# Patient Record
Sex: Male | Born: 1945 | Race: White | Hispanic: No | Marital: Married | Smoking: Former smoker
Health system: Southern US, Community
[De-identification: ages and names within clinical notes are randomized; demographics above are authoritative.]

## PROBLEM LIST (undated history)

## (undated) DIAGNOSIS — K859 Acute pancreatitis without necrosis or infection, unspecified: Secondary | ICD-10-CM

## (undated) DIAGNOSIS — M1990 Unspecified osteoarthritis, unspecified site: Secondary | ICD-10-CM

## (undated) DIAGNOSIS — H9190 Unspecified hearing loss, unspecified ear: Secondary | ICD-10-CM

## (undated) DIAGNOSIS — M797 Fibromyalgia: Secondary | ICD-10-CM

## (undated) DIAGNOSIS — T82868A Thrombosis of vascular prosthetic devices, implants and grafts, initial encounter: Secondary | ICD-10-CM

## (undated) HISTORY — PX: AORTO-FEMORAL BYPASS GRAFT: SHX885

## (undated) HISTORY — PX: CORONARY ANGIOPLASTY WITH STENT PLACEMENT: SHX49

## (undated) HISTORY — PX: CHOLECYSTECTOMY: SHX55

---

## 2018-03-17 ENCOUNTER — Other Ambulatory Visit: Payer: Self-pay

## 2018-03-17 ENCOUNTER — Emergency Department (HOSPITAL_COMMUNITY): Payer: Medicare Other

## 2018-03-17 ENCOUNTER — Emergency Department (HOSPITAL_COMMUNITY)
Admission: EM | Admit: 2018-03-17 | Discharge: 2018-03-17 | Disposition: A | Discharge status: Home / Self Care | Payer: Medicare Other | Attending: Emergency Medicine | Admitting: Emergency Medicine

## 2018-03-17 DIAGNOSIS — Z79899 Other long term (current) drug therapy: Secondary | ICD-10-CM | POA: Insufficient documentation

## 2018-03-17 DIAGNOSIS — W1830XA Fall on same level, unspecified, initial encounter: Secondary | ICD-10-CM | POA: Diagnosis not present

## 2018-03-17 DIAGNOSIS — E119 Type 2 diabetes mellitus without complications: Secondary | ICD-10-CM | POA: Insufficient documentation

## 2018-03-17 DIAGNOSIS — Z7984 Long term (current) use of oral hypoglycemic drugs: Secondary | ICD-10-CM | POA: Insufficient documentation

## 2018-03-17 DIAGNOSIS — I10 Essential (primary) hypertension: Secondary | ICD-10-CM | POA: Diagnosis not present

## 2018-03-17 DIAGNOSIS — Y999 Unspecified external cause status: Secondary | ICD-10-CM | POA: Insufficient documentation

## 2018-03-17 DIAGNOSIS — S0990XA Unspecified injury of head, initial encounter: Secondary | ICD-10-CM | POA: Insufficient documentation

## 2018-03-17 DIAGNOSIS — Y939 Activity, unspecified: Secondary | ICD-10-CM | POA: Diagnosis not present

## 2018-03-17 DIAGNOSIS — S51011A Laceration without foreign body of right elbow, initial encounter: Secondary | ICD-10-CM | POA: Insufficient documentation

## 2018-03-17 DIAGNOSIS — S40011A Contusion of right shoulder, initial encounter: Secondary | ICD-10-CM

## 2018-03-17 DIAGNOSIS — Y92009 Unspecified place in unspecified non-institutional (private) residence as the place of occurrence of the external cause: Secondary | ICD-10-CM | POA: Insufficient documentation

## 2018-03-17 DIAGNOSIS — Z23 Encounter for immunization: Secondary | ICD-10-CM | POA: Insufficient documentation

## 2018-03-17 DIAGNOSIS — T07XXXA Unspecified multiple injuries, initial encounter: Secondary | ICD-10-CM | POA: Diagnosis present

## 2018-03-17 DIAGNOSIS — W19XXXA Unspecified fall, initial encounter: Secondary | ICD-10-CM

## 2018-03-17 LAB — CBG MONITORING, ED: Glucose-Capillary: 145 mg/dL — ABNORMAL HIGH (ref 70–99)

## 2018-03-17 MED ORDER — TETANUS-DIPHTH-ACELL PERTUSSIS 5-2.5-18.5 LF-MCG/0.5 IM SUSP
0.5000 mL | Freq: Once | INTRAMUSCULAR | Status: AC
  Administered 2018-03-17: 0.5 mL via INTRAMUSCULAR
  Filled 2018-03-17: qty 0.5

## 2018-03-17 MED ORDER — BACITRACIN ZINC 500 UNIT/GM EX OINT
1.0000 "application " | TOPICAL_OINTMENT | Freq: Two times a day (BID) | CUTANEOUS | Status: DC
  Administered 2018-03-17: 1 via TOPICAL
  Filled 2018-03-17: qty 0.9

## 2018-03-17 NOTE — ED Notes (Signed)
Patient transported to CT 

## 2018-03-17 NOTE — ED Notes (Signed)
Bed: WA17 Expected date:  Expected time:  Means of arrival:  Comments: 72 yo fall

## 2018-03-17 NOTE — ED Provider Notes (Signed)
[facility]-EMERGENCY DEPT Provider Note   CSN: [id number] Arrival date & time: 03/17/18  1805     History   Chief Complaint Chief Complaint  Patient presents with  . Fall    HPI Timothy Robertson is a 72 y.o. male.  72 year old male w/ past medical history including hypertension, hyperlipidemia, type 2 diabetes mellitus who presents with fall.  This afternoon, the patient was trying to get to the bathroom when he lost his balance and fell against the wall on his left side, bumping the left side of his head and then falling to the floor on his right side.  He does not think he lost consciousness but his wife is concerned that he may have briefly.  He complains of pain in his right shoulder, right elbow, and hip but his hip pain has improved.  He has been able to bear weight on his legs since the fall.  He denies any vision changes, vomiting, chest pain, or abdominal pain.  No anticoagulant use.  Unknown last tetanus.  Pain is moderate and constant.  The history is provided by the patient.  Fall     No past medical history on file.  There are no active problems to display for this patient.   ** The histories are not reviewed yet. Please review them in the "History" navigator section and refresh this SmartLink.    PMH:  HTN HLD T2DM   Home Medications    Prior to Admission medications   Medication Sig Start Date End Date Taking? Authorizing Provider  aspirin EC 81 MG tablet Take 81 mg by mouth every other day.   Yes [provider]  atorvastatin (LIPITOR) 80 MG tablet Take 80 mg by mouth daily.   Yes [provider]  Calcium Carb-Cholecalciferol (CALCIUM-VITAMIN D) 600-400 MG-UNIT TABS Take 1 tablet by mouth daily.   Yes [provider]  cetirizine (ZYRTEC ALLERGY) 10 MG tablet Take 10 mg by mouth daily.   Yes [provider]  gabapentin (NEURONTIN) 300 MG capsule Take 300-600 mg by mouth 2 (two) times daily. Take 1  capsule (300 mg) in the morning and Take 2 capsules (600 mg) in the evening.    Yes [provider]  glipiZIDE (GLUCOTROL) 10 MG tablet Take 10 mg by mouth daily before breakfast.   Yes [provider]  hydrochlorothiazide (HYDRODIURIL) 25 MG tablet Take 25 mg by mouth daily.   Yes [provider]  Ibuprofen (ADVIL MIGRAINE) 200 MG CAPS Take 400 mg by mouth 2 (two) times daily as needed (pain).   Yes [provider]  lisinopril (PRINIVIL,ZESTRIL) 40 MG tablet Take 40 mg by mouth daily.   Yes [provider]  metFORMIN (GLUCOPHAGE) 1000 MG tablet Take 1,000 mg by mouth 2 (two) times daily with a meal.   Yes [provider]  metoprolol tartrate (LOPRESSOR) 50 MG tablet Take 50 mg by mouth 2 (two) times daily.   Yes [provider]  Omega-3 Fatty Acids (FISH OIL) 1000 MG CAPS Take 1 capsule by mouth daily.   Yes [provider]  promethazine (PHENERGAN) 25 MG tablet Take 25 mg by mouth 2 (two) times daily as needed for nausea or vomiting.   Yes [provider]    Family History No family history on file.  Non-contributory  Social History Social History   Tobacco Use  . Smoking status: Not on file  Substance Use Topics  . Alcohol use: Not on file  .  Drug use: Not on file   Denies alcohol, drugs, tobacco use  Allergies   Penicillins and Sulfa antibiotics   Review of Systems Review of Systems All other systems reviewed and are negative except that which was mentioned in HPI   Physical Exam Updated Vital Signs BP 135/62   Pulse 68   Temp 98.2 F (36.8 C) (Oral)   Resp 17   SpO2 96%   Physical Exam  Constitutional: He is oriented to person, place, and time. He appears well-developed and well-nourished. No distress.  HENT:  Head: Normocephalic and atraumatic.  Moist mucous membranes  Eyes: Pupils are equal, round, and reactive to light. Conjunctivae and EOM are normal.  Neck:  In c-collar    Cardiovascular: Normal rate, regular rhythm and normal heart sounds.  No murmur heard. Pulmonary/Chest: Effort normal and breath sounds normal. He exhibits no tenderness.  Abdominal: Soft. Bowel sounds are normal. He exhibits no distension. There is no tenderness.  Musculoskeletal: Normal range of motion. He exhibits no edema or deformity.  Neurological: He is alert and oriented to person, place, and time. No sensory deficit. He exhibits normal muscle tone.  Fluent speech  Skin: Skin is warm and dry.  Skin tear R elbow, ecchymosis R forearm  Psychiatric: He has a normal mood and affect. Judgment normal.  Nursing note and vitals reviewed.    ED Treatments / Results  Labs (all labs ordered are listed, but only abnormal results are displayed) Labs Reviewed  CBG MONITORING, ED - Abnormal; Notable for the following components:      Result Value   Glucose-Capillary 145 (*)    All other components within normal limits    EKG None  Radiology Dg Shoulder Right  Result Date: 03/17/2018 CLINICAL DATA:  Fall, shoulder and forearm pain. EXAM: RIGHT SHOULDER - 2+ VIEW COMPARISON:  None. FINDINGS: There is no evidence of fracture or dislocation. There is no evidence of arthropathy or other focal bone abnormality. Soft tissues are unremarkable. IMPRESSION: Negative. Electronically Signed   By: Bary Richard M.D.   On: 03/17/2018 21:07   Dg Forearm Right  Result Date: 03/17/2018 CLINICAL DATA:  Fall, forearm pain. EXAM: RIGHT FOREARM - 2 VIEW COMPARISON:  None. FINDINGS: There is no evidence of fracture or other focal bone lesions. Soft tissues are unremarkable. IMPRESSION: Negative. Electronically Signed   By: Bary Richard M.D.   On: 03/17/2018 21:07   Ct Head Wo Contrast  Result Date: 03/17/2018 CLINICAL DATA:  Status post fall EXAM: CT HEAD WITHOUT CONTRAST CT CERVICAL SPINE WITHOUT CONTRAST TECHNIQUE: Multidetector CT imaging of the head and cervical spine was performed following the  standard protocol without intravenous contrast. Multiplanar CT image reconstructions of the cervical spine were also generated. COMPARISON:  None. FINDINGS: CT HEAD FINDINGS Brain: Mild generalized age related volume loss with commensurate dilatation of the ventricles and sulci. Mild chronic small vessel ischemic changes within the bilateral periventricular and subcortical white matter regions. No mass, hemorrhage, edema or other evidence of acute parenchymal abnormality. No extra-axial hemorrhage. Vascular: There are chronic calcified atherosclerotic changes of the large vessels at the skull base. No unexpected hyperdense vessel. Skull: Normal. Negative for fracture or focal lesion. Sinuses/Orbits: No acute finding. Other: None. CT CERVICAL SPINE FINDINGS Alignment: Straightening of the normal cervical spine lordosis is likely related to degenerative changes in the mid and lower cervical spine. No evidence of acute vertebral body subluxation. Skull base and vertebrae: No fracture line or displaced fracture fragment identified. Facet joints  appear intact and normally aligned throughout. Soft tissues and spinal canal: No prevertebral fluid or swelling. No visible canal hematoma. Disc levels: Degenerative spondylosis at the C4-5 through C6-7 levels, with associated disc space narrowings and osseous spurring. Associated disc-osteophytic bulges at C4-5 and C6-7 are causing moderate central canal stenoses, most prominent at C6-7, perhaps some associated mass effect on the anterior portion of the cervical cord at C6-7. Upper chest: No acute findings. Mild emphysematous blebs and chronic pleuroparenchymal scarring/fibrosis at the lung apices. Other: Bilateral carotid atherosclerosis. IMPRESSION: 1. No acute intracranial abnormality. No intracranial mass, hemorrhage or edema. No skull fracture. Chronic small vessel ischemic changes within the white matter. 2. No fracture or acute subluxation within the cervical spine. 3.  Degenerative changes of the mid and lower cervical spine, as detailed above. 4. Bilateral carotid atherosclerosis. Electronically Signed   By: Bary Richard M.D.   On: 03/17/2018 21:30   Ct Cervical Spine Wo Contrast  Result Date: 03/17/2018 CLINICAL DATA:  Status post fall EXAM: CT HEAD WITHOUT CONTRAST CT CERVICAL SPINE WITHOUT CONTRAST TECHNIQUE: Multidetector CT imaging of the head and cervical spine was performed following the standard protocol without intravenous contrast. Multiplanar CT image reconstructions of the cervical spine were also generated. COMPARISON:  None. FINDINGS: CT HEAD FINDINGS Brain: Mild generalized age related volume loss with commensurate dilatation of the ventricles and sulci. Mild chronic small vessel ischemic changes within the bilateral periventricular and subcortical white matter regions. No mass, hemorrhage, edema or other evidence of acute parenchymal abnormality. No extra-axial hemorrhage. Vascular: There are chronic calcified atherosclerotic changes of the large vessels at the skull base. No unexpected hyperdense vessel. Skull: Normal. Negative for fracture or focal lesion. Sinuses/Orbits: No acute finding. Other: None. CT CERVICAL SPINE FINDINGS Alignment: Straightening of the normal cervical spine lordosis is likely related to degenerative changes in the mid and lower cervical spine. No evidence of acute vertebral body subluxation. Skull base and vertebrae: No fracture line or displaced fracture fragment identified. Facet joints appear intact and normally aligned throughout. Soft tissues and spinal canal: No prevertebral fluid or swelling. No visible canal hematoma. Disc levels: Degenerative spondylosis at the C4-5 through C6-7 levels, with associated disc space narrowings and osseous spurring. Associated disc-osteophytic bulges at C4-5 and C6-7 are causing moderate central canal stenoses, most prominent at C6-7, perhaps some associated mass effect on the anterior portion  of the cervical cord at C6-7. Upper chest: No acute findings. Mild emphysematous blebs and chronic pleuroparenchymal scarring/fibrosis at the lung apices. Other: Bilateral carotid atherosclerosis. IMPRESSION: 1. No acute intracranial abnormality. No intracranial mass, hemorrhage or edema. No skull fracture. Chronic small vessel ischemic changes within the white matter. 2. No fracture or acute subluxation within the cervical spine. 3. Degenerative changes of the mid and lower cervical spine, as detailed above. 4. Bilateral carotid atherosclerosis. Electronically Signed   By: Bary Richard M.D.   On: 03/17/2018 21:30    Procedures Procedures (including critical care time)  Medications Ordered in ED Medications  bacitracin ointment 1 application (1 application Topical Given 03/17/18 2213)  Tdap (BOOSTRIX) injection 0.5 mL (0.5 mLs Intramuscular Given 03/17/18 2111)     Initial Impression / Assessment and Plan / ED Course  I have reviewed the triage vital signs and the nursing notes.  Pertinent labs & imaging results that were available during my care of the patient were reviewed by me and considered in my medical decision making (see chart for details).     Pt well appearing, alert,  comfortable on exam w/ reassuring VS. Full ROM of joints. CT head and c-spine neg for injury.  Lane films of right shoulder and arm negative.  Dressed wound and applied bacitracin, discussed wound care.  Updated tetanus vaccination.  Discussed supportive measures and reviewed return precautions with the patient and his family including any confusion, vomiting, balance problems, or new neurologic deficits.  They voiced understanding.  Final Clinical Impressions(s) / ED Diagnoses   Final diagnoses:  Fall, initial encounter  Contusion of right shoulder, initial encounter  Skin tear of right elbow without complication, initial encounter  Minor head injury, initial encounter    ED Discharge Orders    None         Shirley Bolle, Ambrose Finland, MD 03/17/18 2237

## 2018-03-17 NOTE — ED Notes (Signed)
CBG 145 per pt's family request

## 2018-03-17 NOTE — ED Triage Notes (Signed)
[place] with witnessed mechanical fall. Fell into wall on left side then fell to floor on right side trying to get to the bathroom. Denies LOC or blood thinners. No deformities, but has right shoulder pain and elbow and hip. Has skin tear on right elbow.

## 2018-06-29 ENCOUNTER — Emergency Department (HOSPITAL_COMMUNITY)
Admission: EM | Admit: 2018-06-29 | Discharge: 2018-06-29 | Disposition: A | Discharge status: Home / Self Care | Payer: Medicare Other | Attending: Emergency Medicine | Admitting: Emergency Medicine

## 2018-06-29 ENCOUNTER — Other Ambulatory Visit: Payer: Self-pay

## 2018-06-29 ENCOUNTER — Encounter (HOSPITAL_COMMUNITY): Payer: Self-pay

## 2018-06-29 ENCOUNTER — Emergency Department (HOSPITAL_COMMUNITY): Payer: Medicare Other

## 2018-06-29 DIAGNOSIS — M542 Cervicalgia: Secondary | ICD-10-CM

## 2018-06-29 DIAGNOSIS — Z7982 Long term (current) use of aspirin: Secondary | ICD-10-CM | POA: Diagnosis not present

## 2018-06-29 DIAGNOSIS — M4802 Spinal stenosis, cervical region: Secondary | ICD-10-CM | POA: Insufficient documentation

## 2018-06-29 DIAGNOSIS — Z79899 Other long term (current) drug therapy: Secondary | ICD-10-CM | POA: Diagnosis not present

## 2018-06-29 DIAGNOSIS — Z87891 Personal history of nicotine dependence: Secondary | ICD-10-CM | POA: Diagnosis not present

## 2018-06-29 HISTORY — DX: Unspecified osteoarthritis, unspecified site: M19.90

## 2018-06-29 HISTORY — DX: Fibromyalgia: M79.7

## 2018-06-29 HISTORY — DX: Acute pancreatitis without necrosis or infection, unspecified: K85.90

## 2018-06-29 HISTORY — DX: Unspecified hearing loss, unspecified ear: H91.90

## 2018-06-29 HISTORY — DX: Thrombosis due to vascular prosthetic devices, implants and grafts, initial encounter: T82.868A

## 2018-06-29 LAB — CBC WITH DIFFERENTIAL/PLATELET
ABS IMMATURE GRANULOCYTES: 0.04 10*3/uL (ref 0.00–0.07)
BASOS PCT: 1 %
Basophils Absolute: 0.1 10*3/uL (ref 0.0–0.1)
EOS PCT: 6 %
Eosinophils Absolute: 0.5 10*3/uL (ref 0.0–0.5)
HCT: 48.2 % (ref 39.0–52.0)
Hemoglobin: 15.5 g/dL (ref 13.0–17.0)
Immature Granulocytes: 0 %
Lymphocytes Relative: 31 %
Lymphs Abs: 2.9 10*3/uL (ref 0.7–4.0)
MCH: 30 pg (ref 26.0–34.0)
MCHC: 32.2 g/dL (ref 30.0–36.0)
MCV: 93.4 fL (ref 80.0–100.0)
MONO ABS: 1.1 10*3/uL — AB (ref 0.1–1.0)
Monocytes Relative: 12 %
NEUTROS ABS: 4.6 10*3/uL (ref 1.7–7.7)
Neutrophils Relative %: 50 %
PLATELETS: 216 10*3/uL (ref 150–400)
RBC: 5.16 MIL/uL (ref 4.22–5.81)
RDW: 14.5 % (ref 11.5–15.5)
WBC: 9.2 10*3/uL (ref 4.0–10.5)
nRBC: 0 % (ref 0.0–0.2)

## 2018-06-29 LAB — BASIC METABOLIC PANEL
ANION GAP: 8 (ref 5–15)
BUN: 17 mg/dL (ref 8–23)
CHLORIDE: 108 mmol/L (ref 98–111)
CO2: 23 mmol/L (ref 22–32)
Calcium: 9.5 mg/dL (ref 8.9–10.3)
Creatinine, Ser: 0.83 mg/dL (ref 0.61–1.24)
GLUCOSE: 100 mg/dL — AB (ref 70–99)
Potassium: 3.9 mmol/L (ref 3.5–5.1)
Sodium: 139 mmol/L (ref 135–145)

## 2018-06-29 MED ORDER — DICLOFENAC SODIUM 1 % TD GEL: 2.0000 g | Freq: Four times a day (QID) | TRANSDERMAL | 0 refills | Status: DC

## 2018-06-29 MED ORDER — LIDOCAINE 5 % EX PTCH
1.0000 | MEDICATED_PATCH | CUTANEOUS | Status: DC
  Administered 2018-06-29: 1 via TRANSDERMAL
  Filled 2018-06-29: qty 1

## 2018-06-29 MED ORDER — LIDOCAINE 5 % EX PTCH: 1.0000 | MEDICATED_PATCH | CUTANEOUS | 0 refills | Status: DC

## 2018-06-29 NOTE — ED Provider Notes (Signed)
[facility]   CSN: [id number] Arrival date & time: 06/29/18  1606     History   Chief Complaint Chief Complaint  Patient presents with  . Neck Pain    HPI Timothy Robertson is a 72 y.o. male.  The history is provided by the patient. No language interpreter was used.  Neck Pain     Timothy Robertson is a 72 y.o. male who presents to the Emergency Department complaining of neck pain. Presents to the emergency department company by his family for evaluation of posterior neck pain for the last three weeks. He has a sharp pain on the left and right posterior neck. He had a fall 2 to 3 months ago but had no neck pain at that time. Pain in his neck is constant in nature at times it is better and at times it is worse. Rotation of his neck does make his pain worse, especially on rotation to the right. He denies any weakness, numbness. Two nights ago he had shortness of breath that resolved after getting up. He currently has no chest pain, shortness of breath, fevers, nausea, vomiting, dysuria, urinary incontinence. Past Medical History:  Diagnosis Date  . Arthritis   . Femoral-femoral bypass graft thrombosis, left (HCC)   . Fibromyalgia   . HOH (hard of hearing)   . Pancreatitis     There are no active problems to display for this patient.   Past Surgical History:  Procedure Laterality Date  . AORTO-FEMORAL BYPASS GRAFT    . CHOLECYSTECTOMY    . CORONARY ANGIOPLASTY WITH STENT PLACEMENT          Home Medications    Prior to Admission medications   Medication Sig Start Date End Date Taking? Authorizing Provider  aspirin EC 81 MG tablet Take 81 mg by mouth every other day.   Yes [provider]  atorvastatin (LIPITOR) 80 MG tablet Take 80 mg by mouth daily.   Yes [provider]  Calcium Carb-Cholecalciferol (CALCIUM-VITAMIN D) 600-400 MG-UNIT TABS Take 1 tablet by mouth daily.   Yes [provider]  cetirizine (ZYRTEC ALLERGY) 10 MG tablet Take 10 mg by mouth daily.   Yes [provider]  gabapentin (NEURONTIN) 300 MG capsule Take 300-600 mg by mouth 2 (two) times daily. Take 1 capsule (300 mg) in the morning and Take 2 capsules (600 mg) in the evening.    Yes [provider]  glipiZIDE (GLUCOTROL) 10 MG tablet Take 10 mg by mouth daily before breakfast.   Yes [provider]  hydrochlorothiazide (HYDRODIURIL) 25 MG tablet Take 25 mg by mouth daily.   Yes [provider]  Ibuprofen (ADVIL MIGRAINE) 200 MG CAPS Take 400 mg by mouth 2 (two) times daily as needed (pain).   Yes [provider]  lisinopril (PRINIVIL,ZESTRIL) 40 MG tablet Take 40 mg by mouth daily.   Yes [provider]  metFORMIN (GLUCOPHAGE) 1000 MG tablet Take 1,000 mg by mouth 2 (two) times daily with a meal.   Yes [provider]  metoprolol tartrate (LOPRESSOR) 50 MG tablet Take 50 mg by mouth 2 (two) times daily.   Yes [provider]  Omega-3 Fatty Acids (FISH OIL) 1000 MG CAPS Take 1 capsule by mouth daily.   Yes [provider]  promethazine (PHENERGAN) 25 MG tablet Take 25 mg by mouth 2 (two) times daily as needed for nausea or vomiting.   Yes [provider]  diclofenac sodium (  VOLTAREN) 1 % GEL Apply 2 g topically 4 (four) times daily. 06/29/18   Tilden Fossa, MD  lidocaine (LIDODERM) 5 % Place 1 patch onto the skin daily. Remove & Discard patch within 12 hours or as directed by MD 06/29/18   Tilden Fossa, MD    Family History Family History  Problem Relation Age of Onset  . Cancer Mother   . Stroke Father     Social History Social History   Tobacco Use  . Smoking status: Former Games developer  . Smokeless tobacco: Never Used  Substance Use Topics  . Alcohol use: Not Currently  . Drug use: Not Currently     Allergies   Penicillins and Sulfa antibiotics   Review of Systems Review of Systems  Musculoskeletal:  Positive for neck pain.  All other systems reviewed and are negative.    Physical Exam Updated Vital Signs BP (!) 188/102   Pulse (!) 58   Temp 97.6 F (36.4 C) (Oral)   Resp 18   Ht 5\' 8"  (1.727 m) Comment: Simultaneous filing. User may not have seen previous data.  Wt 96.2 kg   SpO2 95%   BMI 32.23 kg/m   Physical Exam  Constitutional: He is oriented to person, place, and time. He appears well-developed and well-nourished.  HENT:  Head: Normocephalic and atraumatic.  Neck:  Tenderness to palpation throughout the posterior neck, particularly on the left and right paraspinous region. Pain on a lateral rotation of the neck to the right and left.  Cardiovascular: Normal rate and regular rhythm.  No murmur heard. Pulmonary/Chest: Effort normal and breath sounds normal. No respiratory distress.  Abdominal: Soft. There is no tenderness. There is no rebound and no guarding.  Musculoskeletal: He exhibits no edema or tenderness.  2+ radial and DP pulses bilaterally  Neurological: He is alert and oriented to person, place, and time.  Five out of five strength in all four extremities with sensation to light touch intact in all four extremities. Absent patellar reflexes bilaterally.  Skin: Skin is warm and dry.  Psychiatric: He has a normal mood and affect. His behavior is normal.  Nursing note and vitals reviewed.    ED Treatments / Results  Labs (all labs ordered are listed, but only abnormal results are displayed) Labs Reviewed  BASIC METABOLIC PANEL - Abnormal; Notable for the following components:      Result Value   Glucose, Bld 100 (*)    All other components within normal limits  CBC WITH DIFFERENTIAL/PLATELET - Abnormal; Notable for the following components:   Monocytes Absolute 1.1 (*)    All other components within normal limits    EKG None  Radiology Dg Chest 2 View  Result Date: 06/29/2018 CLINICAL DATA:  Shortness of breath for 2 days. EXAM: CHEST - 2 VIEW  COMPARISON:  None. FINDINGS: Mild cardiomegaly with normal mediastinal contours. Aortic atherosclerosis. Mild interstitial coarsening diffusely. Peripheral reticulation in the upper lung zones suggests scarring. No confluent airspace disease. No pneumothorax or large pleural effusion. No acute osseous abnormalities are seen. IMPRESSION: 1. Mild cardiomegaly with Aortic Atherosclerosis (ICD10-I70.0). 2. Interstitial coarsening of uncertain acuity, this may be chronic or represent atypical infection. Pulmonary edema is felt less likely. 3. Subpleural reticulation in the upper lung zones suggests scarring. Electronically Signed   By: Narda Rutherford M.D.   On: 06/29/2018 21:16   Ct Cervical Spine Wo Contrast  Result Date: 06/29/2018 CLINICAL DATA:  Posterior neck pain for 3 weeks. EXAM: CT CERVICAL SPINE WITHOUT  CONTRAST TECHNIQUE: Multidetector CT imaging of the cervical spine was performed without intravenous contrast. Multiplanar CT image reconstructions were also generated. COMPARISON:  Cervical spine CT 03/17/2018 FINDINGS: Alignment: No static subluxation. Facets are aligned. Occipital condyles and the lateral masses of C1 and C2 are normally approximated. Skull base and vertebrae: No acute fracture. Soft tissues and spinal canal: No prevertebral fluid or swelling. No visible canal hematoma. Disc levels: C2-C3: No spinal canal or foraminal stenosis. C3-C4: No spinal canal stenosis. Mild foraminal narrowing due to uncovertebral hypertrophy. C4-C5: Intermediate disc osteophyte complex with moderate bilateral foraminal narrowing. No spinal canal stenosis. C5-C6: Small disc osteophyte complex without spinal canal stenosis. No neural foraminal stenosis. C6-C7: Large disc osteophyte complex with prominent central component causing severe central spinal canal stenosis. Moderate bilateral neural foraminal stenosis. C7-T1: Unremarkable. T1-T2: Unremarkable. T2-T3: Unremarkable. T3-T4: Unremarkable. Upper chest: No  pneumothorax, pulmonary nodule or pleural effusion. Other: Normal visualized paraspinal cervical soft tissues. IMPRESSION: 1. Severe spinal canal and moderate bilateral foraminal stenosis at C6-7 secondary to the presence of large disc osteophyte complex. 2. Moderate bilateral C4-5 neural foraminal stenosis. 3. No acute fracture or static subluxation of the cervical spine. Electronically Signed   By: Deatra Robinson M.D.   On: 06/29/2018 21:24    Procedures Procedures (including critical care time)  Medications Ordered in ED Medications  lidocaine (LIDODERM) 5 % 1 patch (1 patch Transdermal Patch Applied 06/29/18 2226)     Initial Impression / Assessment and Plan / ED Course  I have reviewed the triage vital signs and the nursing notes.  Pertinent labs & imaging results that were available during my care of the patient were reviewed by me and considered in my medical decision making (see chart for details).     Patient here for evaluation of three weeks of neck pain. He is neurologically intact on examination. CT scan does demonstrate severe spinal stenosis. Presentation is not consistent with RPA, PTA, epiglotitis. Discussed with patient home care for neck pain, spinal stenosis. Plan to have him follow up with neurosurgery as an outpatient. Return precautions discussed.  Final Clinical Impressions(s) / ED Diagnoses   Final diagnoses:  Spinal stenosis of cervical region  Neck pain    ED Discharge Orders         Ordered    lidocaine (LIDODERM) 5 %  Every 24 hours     06/29/18 2230    diclofenac sodium (VOLTAREN) 1 % GEL  4 times daily     06/29/18 2230           Tilden Fossa, MD 06/29/18 2354

## 2018-06-29 NOTE — ED Notes (Signed)
Patient transported to X-ray 

## 2018-06-29 NOTE — ED Notes (Signed)
Patient transported to CT 

## 2018-06-29 NOTE — ED Triage Notes (Signed)
Patient c/o posterior neck pain that started approx 3-4 weeks ago. Patient denies any heavy lifting, moving objects etc.

## 2018-07-03 ENCOUNTER — Observation Stay (HOSPITAL_COMMUNITY)
Admission: EM | Admit: 2018-07-03 | Discharge: 2018-07-05 | Disposition: A | Discharge status: Home / Self Care | Payer: Medicare Other | Attending: Internal Medicine | Admitting: Internal Medicine

## 2018-07-03 ENCOUNTER — Encounter (HOSPITAL_COMMUNITY): Payer: Self-pay | Admitting: Emergency Medicine

## 2018-07-03 ENCOUNTER — Other Ambulatory Visit: Payer: Self-pay

## 2018-07-03 ENCOUNTER — Emergency Department (HOSPITAL_COMMUNITY): Payer: Medicare Other

## 2018-07-03 DIAGNOSIS — H9190 Unspecified hearing loss, unspecified ear: Secondary | ICD-10-CM | POA: Diagnosis not present

## 2018-07-03 DIAGNOSIS — M50323 Other cervical disc degeneration at C6-C7 level: Secondary | ICD-10-CM | POA: Diagnosis not present

## 2018-07-03 DIAGNOSIS — J439 Emphysema, unspecified: Secondary | ICD-10-CM | POA: Diagnosis not present

## 2018-07-03 DIAGNOSIS — I672 Cerebral atherosclerosis: Secondary | ICD-10-CM | POA: Diagnosis not present

## 2018-07-03 DIAGNOSIS — I119 Hypertensive heart disease without heart failure: Secondary | ICD-10-CM | POA: Insufficient documentation

## 2018-07-03 DIAGNOSIS — Z882 Allergy status to sulfonamides status: Secondary | ICD-10-CM | POA: Insufficient documentation

## 2018-07-03 DIAGNOSIS — M2578 Osteophyte, vertebrae: Secondary | ICD-10-CM | POA: Insufficient documentation

## 2018-07-03 DIAGNOSIS — G459 Transient cerebral ischemic attack, unspecified: Secondary | ICD-10-CM | POA: Diagnosis not present

## 2018-07-03 DIAGNOSIS — Z955 Presence of coronary angioplasty implant and graft: Secondary | ICD-10-CM | POA: Diagnosis not present

## 2018-07-03 DIAGNOSIS — M1990 Unspecified osteoarthritis, unspecified site: Secondary | ICD-10-CM | POA: Insufficient documentation

## 2018-07-03 DIAGNOSIS — E785 Hyperlipidemia, unspecified: Secondary | ICD-10-CM | POA: Insufficient documentation

## 2018-07-03 DIAGNOSIS — K859 Acute pancreatitis without necrosis or infection, unspecified: Secondary | ICD-10-CM | POA: Insufficient documentation

## 2018-07-03 DIAGNOSIS — E1151 Type 2 diabetes mellitus with diabetic peripheral angiopathy without gangrene: Secondary | ICD-10-CM | POA: Diagnosis not present

## 2018-07-03 DIAGNOSIS — M4802 Spinal stenosis, cervical region: Secondary | ICD-10-CM | POA: Diagnosis not present

## 2018-07-03 DIAGNOSIS — Z7982 Long term (current) use of aspirin: Secondary | ICD-10-CM | POA: Insufficient documentation

## 2018-07-03 DIAGNOSIS — Z7984 Long term (current) use of oral hypoglycemic drugs: Secondary | ICD-10-CM | POA: Diagnosis not present

## 2018-07-03 DIAGNOSIS — G43909 Migraine, unspecified, not intractable, without status migrainosus: Secondary | ICD-10-CM | POA: Insufficient documentation

## 2018-07-03 DIAGNOSIS — M797 Fibromyalgia: Secondary | ICD-10-CM | POA: Diagnosis not present

## 2018-07-03 DIAGNOSIS — I700 Atherosclerosis of aorta: Secondary | ICD-10-CM | POA: Insufficient documentation

## 2018-07-03 DIAGNOSIS — I447 Left bundle-branch block, unspecified: Secondary | ICD-10-CM | POA: Diagnosis not present

## 2018-07-03 DIAGNOSIS — Z79899 Other long term (current) drug therapy: Secondary | ICD-10-CM | POA: Insufficient documentation

## 2018-07-03 DIAGNOSIS — Z823 Family history of stroke: Secondary | ICD-10-CM | POA: Insufficient documentation

## 2018-07-03 DIAGNOSIS — Z809 Family history of malignant neoplasm, unspecified: Secondary | ICD-10-CM | POA: Insufficient documentation

## 2018-07-03 DIAGNOSIS — J479 Bronchiectasis, uncomplicated: Secondary | ICD-10-CM | POA: Insufficient documentation

## 2018-07-03 DIAGNOSIS — I2510 Atherosclerotic heart disease of native coronary artery without angina pectoris: Secondary | ICD-10-CM | POA: Insufficient documentation

## 2018-07-03 DIAGNOSIS — R4781 Slurred speech: Secondary | ICD-10-CM | POA: Diagnosis not present

## 2018-07-03 DIAGNOSIS — I252 Old myocardial infarction: Secondary | ICD-10-CM | POA: Diagnosis not present

## 2018-07-03 DIAGNOSIS — I340 Nonrheumatic mitral (valve) insufficiency: Secondary | ICD-10-CM | POA: Insufficient documentation

## 2018-07-03 DIAGNOSIS — I639 Cerebral infarction, unspecified: Secondary | ICD-10-CM | POA: Diagnosis present

## 2018-07-03 DIAGNOSIS — Z87891 Personal history of nicotine dependence: Secondary | ICD-10-CM | POA: Insufficient documentation

## 2018-07-03 DIAGNOSIS — R4182 Altered mental status, unspecified: Secondary | ICD-10-CM | POA: Diagnosis present

## 2018-07-03 DIAGNOSIS — Z880 Allergy status to penicillin: Secondary | ICD-10-CM | POA: Insufficient documentation

## 2018-07-03 LAB — DIFFERENTIAL
Abs Immature Granulocytes: 0.08 10*3/uL — ABNORMAL HIGH (ref 0.00–0.07)
BASOS PCT: 1 %
Basophils Absolute: 0.1 10*3/uL (ref 0.0–0.1)
Eosinophils Absolute: 0.5 10*3/uL (ref 0.0–0.5)
Eosinophils Relative: 4 %
Immature Granulocytes: 1 %
Lymphocytes Relative: 24 %
Lymphs Abs: 2.7 10*3/uL (ref 0.7–4.0)
MONO ABS: 1.4 10*3/uL — AB (ref 0.1–1.0)
MONOS PCT: 12 %
NEUTROS ABS: 6.8 10*3/uL (ref 1.7–7.7)
NEUTROS PCT: 58 %

## 2018-07-03 LAB — CBC
HCT: 48.2 % (ref 39.0–52.0)
HEMOGLOBIN: 15.9 g/dL (ref 13.0–17.0)
MCH: 30.1 pg (ref 26.0–34.0)
MCHC: 33 g/dL (ref 30.0–36.0)
MCV: 91.3 fL (ref 80.0–100.0)
PLATELETS: 258 10*3/uL (ref 150–400)
RBC: 5.28 MIL/uL (ref 4.22–5.81)
RDW: 14.2 % (ref 11.5–15.5)
WBC: 11.5 10*3/uL — ABNORMAL HIGH (ref 4.0–10.5)
nRBC: 0 % (ref 0.0–0.2)

## 2018-07-03 LAB — I-STAT CHEM 8, ED
BUN: 30 mg/dL — ABNORMAL HIGH (ref 8–23)
CALCIUM ION: 1.24 mmol/L (ref 1.15–1.40)
Chloride: 107 mmol/L (ref 98–111)
Creatinine, Ser: 1 mg/dL (ref 0.61–1.24)
GLUCOSE: 110 mg/dL — AB (ref 70–99)
HCT: 46 % (ref 39.0–52.0)
HEMOGLOBIN: 15.6 g/dL (ref 13.0–17.0)
Potassium: 4 mmol/L (ref 3.5–5.1)
Sodium: 141 mmol/L (ref 135–145)
TCO2: 24 mmol/L (ref 22–32)

## 2018-07-03 LAB — COMPREHENSIVE METABOLIC PANEL
ALT: 23 U/L (ref 0–44)
AST: 27 U/L (ref 15–41)
Albumin: 3.6 g/dL (ref 3.5–5.0)
Alkaline Phosphatase: 61 U/L (ref 38–126)
Anion gap: 11 (ref 5–15)
BUN: 23 mg/dL (ref 8–23)
CALCIUM: 9.6 mg/dL (ref 8.9–10.3)
CHLORIDE: 105 mmol/L (ref 98–111)
CO2: 22 mmol/L (ref 22–32)
CREATININE: 1.04 mg/dL (ref 0.61–1.24)
Glucose, Bld: 111 mg/dL — ABNORMAL HIGH (ref 70–99)
Potassium: 3.8 mmol/L (ref 3.5–5.1)
Sodium: 138 mmol/L (ref 135–145)
Total Bilirubin: 0.8 mg/dL (ref 0.3–1.2)
Total Protein: 6.8 g/dL (ref 6.5–8.1)

## 2018-07-03 LAB — PROTIME-INR
INR: 0.94
PROTHROMBIN TIME: 12.5 s (ref 11.4–15.2)

## 2018-07-03 LAB — I-STAT TROPONIN, ED: TROPONIN I, POC: 0.01 ng/mL (ref 0.00–0.08)

## 2018-07-03 LAB — APTT: APTT: 34 s (ref 24–36)

## 2018-07-03 LAB — CBG MONITORING, ED: GLUCOSE-CAPILLARY: 107 mg/dL — AB (ref 70–99)

## 2018-07-03 MED ORDER — METOCLOPRAMIDE HCL 5 MG/ML IJ SOLN
10.0000 mg | Freq: Once | INTRAMUSCULAR | Status: AC
  Administered 2018-07-04: 10 mg via INTRAVENOUS
  Filled 2018-07-03: qty 2

## 2018-07-03 MED ORDER — METOCLOPRAMIDE HCL 5 MG/ML IJ SOLN: 5.0000 mg | Freq: Once | INTRAMUSCULAR | Status: DC

## 2018-07-03 MED ORDER — KETOROLAC TROMETHAMINE 30 MG/ML IJ SOLN
15.0000 mg | Freq: Once | INTRAMUSCULAR | Status: AC
  Administered 2018-07-04: 15 mg via INTRAVENOUS
  Filled 2018-07-03: qty 1

## 2018-07-03 NOTE — ED Provider Notes (Signed)
TIME SEEN: 11:56 PM  CHIEF COMPLAINT: Altered mental status  HPI: Patient is a 72 year old male with history of hypertension, diabetes, hyperlipidemia, previous heart attack, peripheral arterial disease on aspirin who presents to the emergency department with concern for possible stroke today.  Patient reports that he has had left posterior and right frontal throbbing headache for 3 days that is consistent with his history of previous migraines.  Family states tonight around 8:30 PM he was confused.  They state that he could not make a sentence and did not seem to understand anything.  Could not follow commands.  The symptoms lasted for approximately 1 hour.  No obvious facial droop or weakness at that time.  No history of previous stroke or TIA.  Has never had complex migraine before.  Is on Fioricet which does not give him much relief.  States Toradol has helped him in the past with his migraines.  ROS: See HPI Constitutional: no fever  Eyes: no drainage  ENT: no runny nose   Cardiovascular:  no chest pain  Resp: no SOB  GI: no vomiting GU: no dysuria Integumentary: no rash  Allergy: no hives  Musculoskeletal: no leg swelling  Neurological: no slurred speech ROS otherwise negative  PAST MEDICAL HISTORY/PAST SURGICAL HISTORY:  Past Medical History:  Diagnosis Date  . Arthritis   . Femoral-femoral bypass graft thrombosis, left (HCC)   . Fibromyalgia   . HOH (hard of hearing)   . Pancreatitis     MEDICATIONS:  Prior to Admission medications   Medication Sig Start Date End Date Taking? Authorizing Provider  aspirin EC 81 MG tablet Take 81 mg by mouth every other day.   Yes [provider]  atorvastatin (LIPITOR) 80 MG tablet Take 80 mg by mouth daily.   Yes [provider]  Calcium Carb-Cholecalciferol (CALCIUM-VITAMIN D) 600-400 MG-UNIT TABS Take 1 tablet by mouth daily.   Yes [provider]  cetirizine (ZYRTEC ALLERGY) 10 MG tablet Take 10 mg by mouth  daily.   Yes [provider]  diclofenac sodium (VOLTAREN) 1 % GEL Apply 2 g topically 4 (four) times daily. 06/29/18  Yes Tilden Fossa, MD  gabapentin (NEURONTIN) 300 MG capsule Take 300-600 mg by mouth See admin instructions. Take 1 capsule (300 mg) in the morning and Take 2 capsules (600 mg) in the evening.    Yes [provider]  glipiZIDE (GLUCOTROL) 10 MG tablet Take 10 mg by mouth daily before breakfast.   Yes [provider]  hydrochlorothiazide (HYDRODIURIL) 25 MG tablet Take 25 mg by mouth daily.   Yes [provider]  Ibuprofen (ADVIL MIGRAINE) 200 MG CAPS Take 400 mg by mouth 2 (two) times daily as needed (pain).   Yes [provider]  lidocaine (LIDODERM) 5 % Place 1 patch onto the skin daily. Remove & Discard patch within 12 hours or as directed by MD 06/29/18  Yes Tilden Fossa, MD  lisinopril (PRINIVIL,ZESTRIL) 40 MG tablet Take 40 mg by mouth daily.   Yes [provider]  metFORMIN (GLUCOPHAGE) 1000 MG tablet Take 1,000 mg by mouth 2 (two) times daily with a meal.   Yes [provider]  metoprolol tartrate (LOPRESSOR) 50 MG tablet Take 50 mg by mouth 2 (two) times daily.   Yes [provider]  Omega-3 Fatty Acids (FISH OIL) 1000 MG CAPS Take 1 capsule by mouth daily.   Yes [provider]  promethazine (PHENERGAN) 25 MG tablet Take 25 mg by mouth 2 (two)  times daily as needed for nausea or vomiting.   Yes [provider]    ALLERGIES:  Allergies  Allergen Reactions  . Penicillins     Has patient had a PCN reaction causing immediate rash, facial/tongue/throat swelling, SOB or lightheadedness with hypotension: Y Has patient had a PCN reaction causing severe rash involving mucus membranes or skin necrosis: Y Has patient had a PCN reaction that required hospitalization: Y Has patient had a PCN reaction occurring within the last 10 years: N If all of the above answers are "NO", then may  proceed with Cephalosporin use.   . Sulfa Antibiotics Itching    SOCIAL HISTORY:  Social History   Tobacco Use  . Smoking status: Former Games developer  . Smokeless tobacco: Never Used  Substance Use Topics  . Alcohol use: Not Currently    FAMILY HISTORY: Family History  Problem Relation Age of Onset  . Cancer Mother   . Stroke Father     EXAM: BP 136/61   Pulse 63   Temp 98.1 F (36.7 C)   Resp 11   Ht 5\' 8"  (1.727 m)   Wt 96 kg   SpO2 98%   BMI 32.18 kg/m  CONSTITUTIONAL: Alert and oriented x 3 and responds appropriately to questions. Well-appearing; well-nourished HEAD: Normocephalic EYES: Conjunctivae clear, pupils appear equal, EOMI ENT: normal nose; moist mucous membranes NECK: Supple, no meningismus, no nuchal rigidity, no LAD  CARD: RRR; S1 and S2 appreciated; no murmurs, no clicks, no rubs, no gallops RESP: Normal chest excursion without splinting or tachypnea; breath sounds clear and equal bilaterally; no wheezes, no rhonchi, no rales, no hypoxia or respiratory distress, speaking full sentences ABD/GI: Normal bowel sounds; non-distended; soft, non-tender, no rebound, no guarding, no peritoneal signs, no hepatosplenomegaly BACK:  The back appears normal and is non-tender to palpation, there is no CVA tenderness EXT: Normal ROM in all joints; non-tender to palpation; no edema; normal capillary refill; no cyanosis, no calf tenderness or swelling    SKIN: Normal color for age and race; warm; no rash NEURO: Moves all extremities equally, strength 5/5 in all 4 extremities, cranial nerves II through XII intact, sensation to light touch intact diffusely, normal speech, no dysmetria to finger-nose testing bilaterally PSYCH: The patient's mood and manner are appropriate. Grooming and personal hygiene are appropriate.  MEDICAL DECISION MAKING: Patient here with concerns for possible TIA.  Had symptoms that lasted 1 hour of difficulty with word finding, difficulty with  understanding and following commands.  Now back to his baseline.  Complaining of migraine headache which is typical for him.  No sudden onset, severe, worst headache of his life.  We will treat headache with Toradol, Reglan.  Labs, head CT here showed no acute abnormality.  Will discuss with neurology for consultation and possible admission for TIA work-up.  ED PROGRESS: 12:42 AM  D/w Dr. Wilford Corner with neurology who agrees TIA versus complex migraine and given risk factors patient needs medicine admission for TIA workup.  Patient still having some headache after Toradol and Reglan.  Will give dose of Compazine.   1:58 AM Discussed patient's case with hospitalist, Dr. Mahala Menghini.  I have recommended admission and patient (and family if present) agree with this plan. Admitting physician will place admission orders.   I reviewed all nursing notes, vitals, pertinent previous records, EKGs, lab and urine results, imaging (as available).     EKG Interpretation  Date/Time:  Monday July 03 2018 22:11:06 EDT Ventricular Rate:  63 PR Interval:  QRS Duration: 176 QT Interval:  481 QTC Calculation: 493 R Axis:   -24 Text Interpretation:  Sinus rhythm Prolonged PR interval Probable left atrial enlargement Left bundle branch block No old tracing to compare Confirmed by Izreal Kock, Baxter Hire [phone]) on 07/03/2018 11:30:03 PM         Liviya Santini, Layla Maw, DO 07/04/18 6045

## 2018-07-03 NOTE — ED Triage Notes (Signed)
Pt brought in by [facility] for a period of AMS that lasted 30 mins per family. [facility] reports the pt is CAO x4 with them but was initially CAO x2 upon their arrival. [facility] reports a negative stroke screen and normal CBG at 110. [facility] reports vital signs stable at 145/84, P-78, R-18.   Pt reports a left sided headache that radiates to the back of his neck. Pt reports this has been going on for 3 weeks now and he has been having bouts of confusion for 3 days. Pt reports he was seen at [facility] and diagnosed with spinal stenosis.   Family reports the period of confusion lasted for 30 minutes. Family reports pt is back to normal at this time.

## 2018-07-04 ENCOUNTER — Inpatient Hospital Stay (HOSPITAL_COMMUNITY): Payer: Medicare Other

## 2018-07-04 DIAGNOSIS — R4182 Altered mental status, unspecified: Secondary | ICD-10-CM | POA: Diagnosis not present

## 2018-07-04 DIAGNOSIS — I639 Cerebral infarction, unspecified: Secondary | ICD-10-CM | POA: Diagnosis present

## 2018-07-04 DIAGNOSIS — G459 Transient cerebral ischemic attack, unspecified: Secondary | ICD-10-CM | POA: Diagnosis not present

## 2018-07-04 LAB — CREATININE, SERUM
Creatinine, Ser: 1 mg/dL (ref 0.61–1.24)
GFR calc Af Amer: >60 mL/min (ref >60)
GFR calc non Af Amer: >60 mL/min (ref >60)

## 2018-07-04 LAB — CBC
HEMATOCRIT: 43.4 % (ref 39.0–52.0)
HEMOGLOBIN: 14.1 g/dL (ref 13.0–17.0)
MCH: 30.1 pg (ref 26.0–34.0)
MCHC: 32.5 g/dL (ref 30.0–36.0)
MCV: 92.5 fL (ref 80.0–100.0)
Platelets: 227 10*3/uL (ref 150–400)
RBC: 4.69 MIL/uL (ref 4.22–5.81)
RDW: 14.2 % (ref 11.5–15.5)
WBC: 10 10*3/uL (ref 4.0–10.5)
nRBC: 0 % (ref 0.0–0.2)

## 2018-07-04 LAB — LIPID PANEL
Cholesterol: 117 mg/dL (ref 0–200)
HDL: 23 mg/dL — AB (ref >40)
LDL Cholesterol: 65 mg/dL (ref 0–99)
TRIGLYCERIDES: 147 mg/dL (ref <150)
Total CHOL/HDL Ratio: 5.1 RATIO
VLDL: 29 mg/dL (ref 0–40)

## 2018-07-04 LAB — RAPID URINE DRUG SCREEN, HOSP PERFORMED
Amphetamines: NOT DETECTED
BARBITURATES: POSITIVE — AB
BENZODIAZEPINES: NOT DETECTED
COCAINE: NOT DETECTED
Opiates: NOT DETECTED
TETRAHYDROCANNABINOL: NOT DETECTED

## 2018-07-04 LAB — GLUCOSE, CAPILLARY: Glucose-Capillary: 153 mg/dL — ABNORMAL HIGH (ref 70–99)

## 2018-07-04 MED ORDER — GABAPENTIN 300 MG PO CAPS
600.0000 mg | ORAL_CAPSULE | Freq: Every day | ORAL | Status: DC
  Administered 2018-07-04: 600 mg via ORAL
  Filled 2018-07-04: qty 2

## 2018-07-04 MED ORDER — ACETAMINOPHEN 650 MG RE SUPP: 650.0000 mg | RECTAL | Status: DC | PRN

## 2018-07-04 MED ORDER — IBUPROFEN 400 MG PO TABS
400.0000 mg | ORAL_TABLET | Freq: Two times a day (BID) | ORAL | Status: DC | PRN
  Filled 2018-07-04: qty 1

## 2018-07-04 MED ORDER — STROKE: EARLY STAGES OF RECOVERY BOOK
Freq: Once | Status: AC
  Administered 2018-07-04: 04:00:00
  Filled 2018-07-04: qty 1

## 2018-07-04 MED ORDER — SENNOSIDES-DOCUSATE SODIUM 8.6-50 MG PO TABS
1.0000 | ORAL_TABLET | Freq: Every day | ORAL | Status: DC
  Administered 2018-07-04: 1 via ORAL
  Filled 2018-07-04: qty 1

## 2018-07-04 MED ORDER — ASPIRIN EC 325 MG PO TBEC
325.0000 mg | DELAYED_RELEASE_TABLET | Freq: Every day | ORAL | Status: DC
  Administered 2018-07-05: 325 mg via ORAL
  Filled 2018-07-04: qty 1

## 2018-07-04 MED ORDER — IOPAMIDOL (ISOVUE-370) INJECTION 76%
INTRAVENOUS | Status: AC
  Administered 2018-07-04: 50 mL
  Filled 2018-07-04: qty 50

## 2018-07-04 MED ORDER — ATORVASTATIN CALCIUM 80 MG PO TABS
80.0000 mg | ORAL_TABLET | Freq: Every day | ORAL | Status: DC
  Administered 2018-07-04 – 2018-07-05 (×2): 80 mg via ORAL
  Filled 2018-07-04 (×2): qty 1

## 2018-07-04 MED ORDER — ASPIRIN EC 81 MG PO TBEC: 81.0000 mg | DELAYED_RELEASE_TABLET | Freq: Every day | ORAL | Status: DC

## 2018-07-04 MED ORDER — GABAPENTIN 300 MG PO CAPS: 300.0000 mg | ORAL_CAPSULE | ORAL | Status: DC

## 2018-07-04 MED ORDER — ENOXAPARIN SODIUM 40 MG/0.4ML ~~LOC~~ SOLN
40.0000 mg | SUBCUTANEOUS | Status: DC
  Administered 2018-07-04 – 2018-07-05 (×2): 40 mg via SUBCUTANEOUS
  Filled 2018-07-04 (×2): qty 0.4

## 2018-07-04 MED ORDER — ACETAMINOPHEN 160 MG/5ML PO SOLN: 650.0000 mg | ORAL | Status: DC | PRN

## 2018-07-04 MED ORDER — PROCHLORPERAZINE EDISYLATE 10 MG/2ML IJ SOLN
10.0000 mg | Freq: Once | INTRAMUSCULAR | Status: AC
  Administered 2018-07-04: 10 mg via INTRAVENOUS
  Filled 2018-07-04: qty 2

## 2018-07-04 MED ORDER — PROMETHAZINE HCL 25 MG PO TABS: 25.0000 mg | ORAL_TABLET | Freq: Two times a day (BID) | ORAL | Status: DC | PRN

## 2018-07-04 MED ORDER — ASPIRIN EC 81 MG PO TBEC: 81.0000 mg | DELAYED_RELEASE_TABLET | ORAL | Status: DC

## 2018-07-04 MED ORDER — METOPROLOL TARTRATE 50 MG PO TABS
50.0000 mg | ORAL_TABLET | Freq: Two times a day (BID) | ORAL | Status: DC
  Administered 2018-07-04 (×2): 50 mg via ORAL
  Filled 2018-07-04 (×3): qty 1

## 2018-07-04 MED ORDER — ACETAMINOPHEN 325 MG PO TABS
650.0000 mg | ORAL_TABLET | ORAL | Status: DC | PRN
  Administered 2018-07-04: 650 mg via ORAL
  Filled 2018-07-04: qty 2

## 2018-07-04 MED ORDER — LISINOPRIL 20 MG PO TABS
40.0000 mg | ORAL_TABLET | Freq: Every day | ORAL | Status: DC
  Administered 2018-07-04 – 2018-07-05 (×2): 40 mg via ORAL
  Filled 2018-07-04 (×2): qty 2

## 2018-07-04 MED ORDER — METFORMIN HCL 500 MG PO TABS
1000.0000 mg | ORAL_TABLET | Freq: Two times a day (BID) | ORAL | Status: DC
  Administered 2018-07-04 – 2018-07-05 (×3): 1000 mg via ORAL
  Filled 2018-07-04 (×3): qty 2

## 2018-07-04 MED ORDER — ASPIRIN 325 MG PO TABS
325.0000 mg | ORAL_TABLET | Freq: Every day | ORAL | Status: DC
  Administered 2018-07-04: 325 mg via ORAL
  Filled 2018-07-04: qty 1

## 2018-07-04 MED ORDER — SODIUM CHLORIDE 0.9 % IV SOLN
INTRAVENOUS | Status: DC
  Administered 2018-07-04: 04:00:00 via INTRAVENOUS
  Administered 2018-07-04: 1000 mL via INTRAVENOUS

## 2018-07-04 MED ORDER — GABAPENTIN 300 MG PO CAPS
300.0000 mg | ORAL_CAPSULE | Freq: Every day | ORAL | Status: DC
  Administered 2018-07-04 – 2018-07-05 (×2): 300 mg via ORAL
  Filled 2018-07-04 (×2): qty 1

## 2018-07-04 NOTE — Procedures (Signed)
ELECTROENCEPHALOGRAM REPORT   Patient: Timothy Robertson       Room #: 4U98J EEG No. ID: [id number] Age: 72 y.o.        Sex: male Referring Physician: Margo Aye Report Date:  07/04/2018        Interpreting Physician: Thana Farr  History: Timothy Robertson is an 72 y.o. male with an episode of confusion  Medications:  ASA, Lipitor, Neurontin, Prinivil, Glucophage, Lopressor, Senokot  Conditions of Recording:  This is a 21 channel routine scalp EEG performed with bipolar and monopolar montages arranged in accordance to the international 10/20 system of electrode placement. One channel was dedicated to EKG recording.  The patient is in the awake, drowsy and asleep states.  Description:  The waking background activity consists of a low voltage, symmetrical, fairly well organized, 8 Hz alpha activity, seen from the parieto-occipital and posterior temporal regions.  Low voltage fast activity, poorly organized, is seen anteriorly and is at times superimposed on more posterior regions.  A mixture of theta and alpha rhythms are seen from the central and temporal regions. The patient drowses with slowing to irregular, low voltage theta and beta activity.   The patient goes in to a light sleep with symmetrical sleep spindles, vertex central sharp transients and irregular slow activity.  No epileptiform activity is noted.   Hyperventilation was not performed.  Intermittent photic stimulation was performed but failed to illicit any change in the tracing.    IMPRESSION: Normal electroencephalogram, awake, asleep and with activation procedures. There are no focal lateralizing or epileptiform features.   Thana Farr, MD Neurology [phone] 07/04/2018, 12:21 PM

## 2018-07-04 NOTE — Progress Notes (Signed)
Physical Therapy Evaluation Patient Details Name: Hendrix Yurkovich MRN: [id number] DOB: [birth year] Today's Date: 07/04/2018   History of Present Illness  Pt is 72 year old male admitted to hospital for a 30-minute 1 hour episode of confusion per the family report with possible headache with a history of migraines .  CT head/MRI negative for acute findings.    Clinical Impression  Pt tolerated evaluation well. Pt able to transfer, negotiate stairs and ambulate with supervision and verbal cues for safety and stability. At this time, anticipate pt is appropriate for d/c home with family support and supervision. Currently not recommending further PT follow up  Follow Up Recommendations No PT follow up;Supervision/Assistance - 24 hour    Equipment Recommendations  None recommended by PT    Recommendations for Other Services       Precautions / Restrictions Precautions Precautions: Fall Restrictions Weight Bearing Restrictions: No      Mobility  Bed Mobility Overal bed mobility: Modified Independent             General bed mobility comments: Pt went supine to sit with HOB elevated without physical assist or cueing  Transfers Overall transfer level: Modified independent Equipment used: None Transfers: Sit to/from Stand Sit to Stand: Modified independent (Device/Increase time)         General transfer comment: Pt able to go sit to stand without physical assistance or cues. Used Bil UE to push off from bed to power up with increased time   Ambulation/Gait Ambulation/Gait assistance: Supervision Gait Distance (Feet): 100 Feet Assistive device: None Gait Pattern/deviations: Step-through pattern;Decreased stride length Gait velocity: decreased Gait velocity interpretation: <1.8 ft/sec, indicate of risk for recurrent falls General Gait Details: Pt had right sided drift and was cued to stay midline. Pt cued for increased cadence for stability and balance  Stairs Stairs:  Yes Stairs assistance: Supervision Stair Management: One rail Right Number of Stairs: 3 General stair comments: Pt negotiated stairs up and back in hospital gym  Wheelchair Mobility    Modified Rankin (Stroke Patients Only) Modified Rankin (Stroke Patients Only) Pre-Morbid Rankin Score: No significant disability Modified Rankin: No significant disability     Balance Overall balance assessment: No apparent balance deficits (not formally assessed)                                           Pertinent Vitals/Pain Pain Assessment: No/denies pain    Home Living Family/patient expects to be discharged to:: Private residence Living Arrangements: Spouse/significant other(Spouse, daughter and son in Social worker) Available Help at Discharge: Family Type of Home: Mobile home Home Access: Stairs to enter Entrance Stairs-Rails: Lawyer of Steps: 6 Home Layout: One level Home Equipment: Environmental consultant - 2 wheels;Cane - single point Additional Comments: Pt noted steps to enter front and back of house. One rail per set of stairs. Pt notes him and his wife both have DME at home and daughter is the only one actively employed.    Prior Function Level of Independence: Independent with assistive device(s)   Gait / Transfers Assistance Needed: Patient uses cane for ambulation     Comments: Pt states he cares for his wife at home      Hand Dominance        Extremity/Trunk Assessment   Upper Extremity Assessment Upper Extremity Assessment: Defer to OT evaluation    Lower Extremity Assessment Lower Extremity Assessment: Overall [facility]  for tasks assessed       Communication   Communication: No difficulties  Cognition Arousal/Alertness: Awake/alert Behavior During Therapy: WFL for tasks assessed/performed Overall Cognitive Status: Within Functional Limits for tasks assessed                                        General Comments General  comments (skin integrity, edema, etc.): Pt states he leaves the home once a day and uses the railing to ascend and decscend the steps. States that family is available 24/7 upon d/c.    Exercises     Assessment/Plan    PT Assessment Patent does not need any further PT services  PT Problem List         PT Treatment Interventions      PT Goals (Current goals can be found in the Care Plan section)  Acute Rehab PT Goals Patient Stated Goal: To go home PT Goal Formulation: With patient Time For Goal Achievement: 07/18/18 Potential to Achieve Goals: Good    Frequency     Barriers to discharge        Co-evaluation               AM-PAC PT "6 Clicks" Daily Activity  Outcome Measure Difficulty turning over in bed (including adjusting bedclothes, sheets and blankets)?: A Little Difficulty moving from lying on back to sitting on the side of the bed? : A Little Difficulty sitting down on and standing up from a chair with arms (e.g., wheelchair, bedside commode, etc,.)?: A Little Help needed moving to and from a bed to chair (including a wheelchair)?: A Little Help needed walking in hospital room?: A Little Help needed climbing 3-5 steps with a railing? : A Little 6 Click Score: 18    End of Session Equipment Utilized During Treatment: Gait belt Activity Tolerance: Patient tolerated treatment well Patient left: in bed;with call bell/phone within reach(EEG technician ) Nurse Communication: Mobility status PT Visit Diagnosis: Other symptoms and signs involving the nervous system (R29.898)    Time: 9604-5409 PT Time Calculation (min) (ACUTE ONLY): 25 min   Charges:   PT Evaluation $PT Eval Moderate Complexity: 1 Mod PT Treatments $Gait Training: 8-22 mins        Denman George, SPT  Denman George 07/04/2018, 12:13 PM

## 2018-07-04 NOTE — Progress Notes (Addendum)
STROKE TEAM PROGRESS NOTE   INTERVAL HISTORY No family is at the bedside.  Pt reports having a watery large stool during which he strained yesterday, followed by a 10/10 headache. HA was accompanied by bright lights and loud noises. He has hx migraine HA and has been taking topamax for prevention and tylenol/butalbital for rescue. Historically he has gotten HAs when the weather changes (Robertson and fall). He is followed by Dr. Manson Robertson at the [facility] in [place].   Vitals:   07/04/18 0230 07/04/18 0300 07/04/18 0500 07/04/18 0700  BP: (!) 157/86 (!) 155/65 (!) 124/58 139/66  Pulse: 61 (!) 57 66 (!) 57  Resp: (!) 24 18 18 18   Temp:  97.8 F (36.6 C)  98.1 F (36.7 C)  TempSrc:    Oral  SpO2: 100% 96% 99% 97%  Weight:      Height:        CBC:  Recent Labs  Lab 06/29/18 2047 07/03/18 2230 07/03/18 2241 07/04/18 0235  WBC 9.2 11.5*  --  10.0  NEUTROABS 4.6 6.8  --   --   HGB 15.5 15.9 15.6 14.1  HCT 48.2 48.2 46.0 43.4  MCV 93.4 91.3  --  92.5  PLT 216 258  --  227    Basic Metabolic Panel:  Recent Labs  Lab 06/29/18 2047 07/03/18 2230 07/03/18 2241 07/04/18 0235  NA 139 138 141  --   K 3.9 3.8 4.0  --   CL 108 105 107  --   CO2 23 22  --   --   GLUCOSE 100* 111* 110*  --   BUN 17 23 30*  --   CREATININE 0.83 1.04 1.00 1.00  CALCIUM 9.5 9.6  --   --    Lipid Panel:     Component Value Date/Time   CHOL 117 07/04/2018 0235   TRIG 147 07/04/2018 0235   HDL 23 (L) 07/04/2018 0235   CHOLHDL 5.1 07/04/2018 0235   VLDL 29 07/04/2018 0235   LDLCALC 65 07/04/2018 0235   HgbA1c: No results found for: HGBA1C Urine Drug Screen: No results found for: LABOPIA, COCAINSCRNUR, LABBENZ, AMPHETMU, THCU, LABBARB  Alcohol Level No results found for: [facility]  IMAGING Ct Head Wo Contrast  Result Date: 07/03/2018 CLINICAL DATA:  TIA.  Altered mental status. EXAM: CT HEAD WITHOUT CONTRAST TECHNIQUE: Contiguous axial images were obtained from the base of the skull through the vertex  without intravenous contrast. COMPARISON:  None. FINDINGS: Brain: There is atrophy and chronic small vessel disease changes. No acute intracranial abnormality. Specifically, no hemorrhage, hydrocephalus, mass lesion, acute infarction, or significant intracranial injury. Vascular: No hyperdense vessel or unexpected calcification. Skull: No acute calvarial abnormality. Sinuses/Orbits: No acute findings Other: None IMPRESSION: No acute intracranial abnormality. Atrophy, chronic microvascular disease. Electronically Signed   By: Timothy Robertson M.D.   On: 07/03/2018 23:34   Mr Brain Wo Contrast  Result Date: 07/04/2018 CLINICAL DATA:  Speech difficulty and headache EXAM: MRI HEAD WITHOUT CONTRAST TECHNIQUE: Multiplanar, multiecho pulse sequences of the brain and surrounding structures were obtained without intravenous contrast. COMPARISON:  Head CT from yesterday FINDINGS: Brain: No acute infarction, hemorrhage, hydrocephalus, extra-axial collection or mass lesion. Mild FLAIR hyperintensity in the cerebral white matter attributed to chronic small vessel ischemia. There has been a discrete remote infarct in the bifrontal white matter, with band of gliosis crossing the corpus callosum. Vascular: Major flow voids are preserved Skull and upper cervical spine: No evidence of marrow lesion Sinuses/Orbits: Mild mucosal  thickening with retention cysts in the right maxillary sinus. IMPRESSION: 1. No acute or reversible finding. 2. Chronic small vessel ischemia in the cerebral white matter, overall mild. Electronically Signed   By: Timothy Robertson M.D.   On: 07/04/2018 08:51   EEG Normal electroencephalogram, awake, asleep and with activation procedures. There are no focal lateralizing or epileptiform features.  2D Echocardiogram  pending    PHYSICAL EXAM GENERAL: Awake, alert in NAD HEENT: - Normocephalic and atraumatic LUNGS - Clear to auscultation bilaterally with no wheezes CV - S1S2 RRR Ext: warm, well  perfused, intact peripheral pulses, no edema  NEURO:  Mental Status: AA&Ox3  Language: speech is clear.  Naming, repetition, fluency, and comprehension intact. Cranial Nerves: PERRL. EOMI, visual fields full, no facial asymmetry, facial sensation intact, hearing intact, tongue/uvula/soft palate midline, normal sternocleidomastoid and trapezius muscle strength. No evidence of tongue atrophy or fibrillations Motor: 5/5 in all fours with no vertical drift. Tone: is normal and bulk is normal Sensation- Intact to light touch bilaterally.  No extinction on double simultaneous stimulation Coordination: FTN intact bilaterally Gait- deferred   ASSESSMENT/PLAN [name] is a 72 y.o. male with history of DB, HTN, CAD, PVD s/p L fem pop, migraines, fibromyalgia, pancreatitis presenting with 1 hr confusion w/ HA following, speech difficulty. He has no memory of episode.   Migraine HA with slurred speech  CT head No acute stroke. Small vessel disease. Atrophy.   MRI  No acute stroke  CTA head & neck pending   2D Echo  pending   EEG neg for seizure  LDL 65  HgbA1c pending   Lovenox 40 mg sq daily for VTE prophylaxis  aspirin 81 mg daily prior to admission, now on aspirin 325 mg daily. Continue aspirin 81 mg daily as prior to admission. Orders adjusted  Therapy recommendations:  No therapy needs   Disposition:  Return home w/ wife  Hypertension  Stable . BP goal normotensive  Hyperlipidemia  Home meds:  lipitor 80 and fish oil, Lipitor resumed in hospital  LDL 65, goal < 70  Continue statin at discharge  Diabetes type II  HgbA1c pending, goal < 7.0  Other Stroke Risk Factors  Advanced age  Former Cigarette smoker, quit 12 yrs ago  Hx ETOH use, none now  UDS / ETOH level not performed   Obesity, Body mass index is 32.18 kg/m., recommend weight loss, diet and exercise as appropriate   Family hx stroke (mother)  Coronary artery disease  Migraines  - per pt on Topamax for prevention (not on in hospital), tylenol/butalbital for rescue. Recommend resume topamax  PVD s/p L fem pop  Other Active Problems  Fibromyalgia  Hospital day # 0  Timothy Main, MSN, APRN, ANVP-BC, AGPCNP-BC Advanced Practice Stroke Nurse [facility] See Amion for Schedule & Pager information 07/04/2018 3:25 PM   ATTENDING NOTE: I reviewed above note and agree with the assessment and plan. Pt was seen and examined.   72 year old right-handed male with history of diabetes, hypertension, CAD, PAD status post bypass, migraine headache and fibromyalgia admitted for episode of confusion and word finding difficulty.  As per patient and wife, he was watching TV and started to have acute onset of confusion and garbled speech, word finding difficulty, lasted about 30 minutes to 1 hour.  30 minutes into the confusion episode, he started to have a headache, originating from back of the head radiating to the vertex and bifrontal region.  He stated that this was  his typical migraine headache, which fluctuating with weather changes.  He was not sure about whether this speech difficulty happened before with headache.  Currently he is asymptomatic.  CT no acute abnormality.  MRI no acute stroke.  CT head and neck showed right ICA 50% stenosis with plaque formation.  EEG normal.  2D echo pending.  LDL 65 and A1c pending.  Patient episode likely complicated migraine given typical symptoms of his migraine, and episode 30 minutes proceeded of migraine headache.  His stroke work-up so far unremarkable except right ICA 50% stenosis with plaque formation.  However, TIA cannot be completely ruled out at this time.  Recommend increase aspirin 81 to aspirin 325 on discharge for further stroke prevention.  Continue Lipitor 80.  Neurology will sign off. Please call with questions. No need neuro follow up at this time. Thanks for the consult.   Marvel Plan, MD PhD Stroke  Neurology 07/04/2018 6:19 PM    Marvel Plan, MD PhD Stroke Neurology 07/04/2018 6:12 PM     To contact Stroke Continuity provider, please refer to [email or web address]. After hours, contact General Neurology

## 2018-07-04 NOTE — Progress Notes (Signed)
Arrived from ED. Alert and oriented. Denies any pain. Ambulatory from stretcher to bed. Call light with reach.

## 2018-07-04 NOTE — Progress Notes (Signed)
OT Cancellation Note  Patient Details Name: Timothy Robertson MRN: [id number] DOB: Oct 13, 1945   Cancelled Treatment:    Reason Eval/Treat Not Completed: Patient at procedure or test/ unavailable(CT)  Thornell Mule, OT/L   Acute OT Clinical Specialist Acute Rehabilitation Services Pager [phone] Office [phone]  07/04/2018, 3:26 PM

## 2018-07-04 NOTE — Progress Notes (Signed)
1 card taken to safe per pt request.

## 2018-07-04 NOTE — Plan of Care (Signed)
  Problem: Health Behavior/Discharge Planning: Goal: Ability to manage health-related needs will improve Outcome: Progressing   Problem: Clinical Measurements: Goal: Ability to maintain clinical measurements within normal limits will improve Outcome: Progressing Goal: Diagnostic test results will improve Outcome: Progressing Goal: Cardiovascular complication will be avoided Outcome: Progressing   Problem: Nutrition: Goal: Adequate nutrition will be maintained Outcome: Progressing   Problem: Coping: Goal: Level of anxiety will decrease Outcome: Progressing   Problem: Safety: Goal: Ability to remain free from injury will improve Outcome: Progressing   Problem: Skin Integrity: Goal: Risk for impaired skin integrity will decrease Outcome: Progressing   Problem: Education: Goal: Knowledge of disease or condition will improve Outcome: Progressing   Problem: Nutrition: Goal: Risk of aspiration will decrease Outcome: Progressing   Problem: Ischemic Stroke/TIA Tissue Perfusion: Goal: Complications of ischemic stroke/TIA will be minimized Outcome: Progressing

## 2018-07-04 NOTE — Consult Note (Signed)
Neurology Consultation  Reason for Consult: Concern for TIA Referring Physician: Dr. Elesa Massed  CC: Speech difficulty, headache  History is obtained from: Patient, chart  HPI: Timothy Robertson is a 72 y.o. male was a past medical history of diabetes, hypertension, coronary artery disease, peripheral vascular disease, who was in his usual state of health and last normal at 8:30 PM on 07/03/2018, when the family noticed a 30-minute 1 hour episode of what was initially described as confusion.  The patient does not remember this episode.  There is no family members at the bedside to describe this and information was described from the chart. The ED provider who spoke with the family said that the patient was unable to perform proper sentences and was unable to follow any commands.  There was no obvious facial weakness or arm or leg weakness and the symptoms lasted for about 1 hour. Patient also complains of a headache that has been ongoing off and on for a while but especially at 8:30 PM yesterday, he started having a headache on the back of his head which was atypical for his migraines.  His migraines usually involve the front of his head bilaterally with a throbbing headache and this was a headache on the back of his head much different in character than his usual migraines.  He was given a migraine cocktail in the emergency room with some improvement of his headache. I was called and the case was discussed with me by the ED provider for concern for TIA and admission for TIA work-up given his risk factors.  Noncontrast CT of the head was done which showed no acute changes.    LKW: 8:30 PM on 07/03/2018 tpa given?: no, symptoms resolved-NIH 0 Premorbid modified Rankin scale (mRS):0  ROS: ROS was performed and is negative except as noted in the HPI.    Past Medical History:  Diagnosis Date  . Arthritis   . Femoral-femoral bypass graft thrombosis, left (HCC)   . Fibromyalgia   . HOH (hard of  hearing)   . Pancreatitis     Family History  Problem Relation Age of Onset  . Cancer Mother   . Stroke Father     Social History:   reports that he has quit smoking. He has never used smokeless tobacco. He reports that he drank alcohol. He reports that he has current or past drug history. Quit smoking 12 years ago.  Denies illicit drug use.  Denies alcohol abuse  Medications  Current Facility-Administered Medications:  .  0.9 %  sodium chloride infusion, , Intravenous, Continuous, Samtani, Jai-Gurmukh, MD, Last Rate: 75 mL/hr at 07/04/18 0342 .  acetaminophen (TYLENOL) tablet 650 mg, 650 mg, Oral, Q4H PRN **OR** acetaminophen (TYLENOL) solution 650 mg, 650 mg, Per Tube, Q4H PRN **OR** acetaminophen (TYLENOL) suppository 650 mg, 650 mg, Rectal, Q4H PRN, Mahala Menghini, Jai-Gurmukh, MD .  aspirin EC tablet 81 mg, 81 mg, Oral, QODAY, Samtani, Jai-Gurmukh, MD .  atorvastatin (LIPITOR) tablet 80 mg, 80 mg, Oral, Daily, Samtani, Jai-Gurmukh, MD .  enoxaparin (LOVENOX) injection 40 mg, 40 mg, Subcutaneous, Q24H, Samtani, Jai-Gurmukh, MD .  gabapentin (NEURONTIN) capsule 300 mg, 300 mg, Oral, Daily, Samtani, Jai-Gurmukh, MD .  gabapentin (NEURONTIN) capsule 600 mg, 600 mg, Oral, QHS, Samtani, Jai-Gurmukh, MD .  ibuprofen (ADVIL,MOTRIN) tablet 400 mg, 400 mg, Oral, BID PRN, Mahala Menghini, Jai-Gurmukh, MD .  lisinopril (PRINIVIL,ZESTRIL) tablet 40 mg, 40 mg, Oral, Daily, Samtani, Jai-Gurmukh, MD .  metFORMIN (GLUCOPHAGE) tablet 1,000 mg, 1,000 mg, Oral, BID WC,  Rhetta Mura, MD .  metoprolol tartrate (LOPRESSOR) tablet 50 mg, 50 mg, Oral, BID, Mahala Menghini, Jai-Gurmukh, MD .  promethazine (PHENERGAN) tablet 25 mg, 25 mg, Oral, BID PRN, Mahala Menghini, Jai-Gurmukh, MD .  senna-docusate (Senokot-S) tablet 1 tablet, 1 tablet, Oral, QHS, Rhetta Mura, MD  Exam: Current vital signs: BP (!) 124/58 (BP Location: Left Arm)   Pulse 66   Temp 97.8 F (36.6 C)   Resp 18   Ht 5\' 8"  (1.727 m)   Wt 96 kg    SpO2 99%   BMI 32.18 kg/m  Vital signs in last 24 hours: Temp:  [97.8 F (36.6 C)-98.1 F (36.7 C)] 97.8 F (36.6 C) (10/22 0300) Pulse Rate:  [57-73] 66 (10/22 0500) Resp:  [11-26] 18 (10/22 0500) BP: (124-174)/(58-86) 124/58 (10/22 0500) SpO2:  [94 %-100 %] 99 % (10/22 0500) Weight:  [96 kg] 96 kg (10/21 2252) GENERAL: Awake, alert in NAD HEENT: - Normocephalic and atraumatic, dry mm, no LN++, no Thyromegally LUNGS - Clear to auscultation bilaterally with no wheezes CV - S1S2 RRR, no m/r/g, equal pulses bilaterally. ABDOMEN - Soft, nontender, nondistended with normoactive BS Ext: warm, well perfused, intact peripheral pulses, no edema  NEURO:  Mental Status: AA&Ox3  Language: speech is clear.  Naming, repetition, fluency, and comprehension intact. Cranial Nerves: PERRL. EOMI, visual fields full, no facial asymmetry, facial sensation intact, hearing intact, tongue/uvula/soft palate midline, normal sternocleidomastoid and trapezius muscle strength. No evidence of tongue atrophy or fibrillations Motor: 5/5 in all fours with no vertical drift. Tone: is normal and bulk is normal Sensation- Intact to light touch bilaterally.  No extinction on double simultaneous stimulation Coordination: FTN intact bilaterally Gait- deferred  NIHSS-0   Labs I have reviewed labs in epic and the results pertinent to this consultation are: CBC    Component Value Date/Time   WBC 10.0 07/04/2018 0235   RBC 4.69 07/04/2018 0235   HGB 14.1 07/04/2018 0235   HCT 43.4 07/04/2018 0235   PLT 227 07/04/2018 0235   MCV 92.5 07/04/2018 0235   MCH 30.1 07/04/2018 0235   MCHC 32.5 07/04/2018 0235   RDW 14.2 07/04/2018 0235   LYMPHSABS 2.7 07/03/2018 2230   MONOABS 1.4 (H) 07/03/2018 2230   EOSABS 0.5 07/03/2018 2230   BASOSABS 0.1 07/03/2018 2230   CMP     Component Value Date/Time   NA 141 07/03/2018 2241   K 4.0 07/03/2018 2241   CL 107 07/03/2018 2241   CO2 22 07/03/2018 2230   GLUCOSE 110  (H) 07/03/2018 2241   BUN 30 (H) 07/03/2018 2241   CREATININE 1.00 07/04/2018 0235   CALCIUM 9.6 07/03/2018 2230   PROT 6.8 07/03/2018 2230   ALBUMIN 3.6 07/03/2018 2230   AST 27 07/03/2018 2230   ALT 23 07/03/2018 2230   ALKPHOS 61 07/03/2018 2230   BILITOT 0.8 07/03/2018 2230   GFRNONAA >60 07/04/2018 0235   GFRAA >60 07/04/2018 0235   Lipid Panel     Component Value Date/Time   CHOL 117 07/04/2018 0235   TRIG 147 07/04/2018 0235   HDL 23 (L) 07/04/2018 0235   CHOLHDL 5.1 07/04/2018 0235   VLDL 29 07/04/2018 0235   LDLCALC 65 07/04/2018 0235   Imaging I have reviewed the images obtained:  CT-scan of the brain-no acute changes  Assessment:  72 year old man past medical history of diabetes, hypertension, coronary artery disease, peripheral vascular disease with a 30-minute 1 hour episode of word finding difficulty/aphasia in the setting of possible headache with  a history of migraines but this headache being different than his usual headaches. Speech symptoms resolved in 30 minutes to 1 hour whereas the headache started to improve only after a migraine cocktail was given in the ED many hours after the resolution of his feet symptoms. Given the risk factors and symptoms of a aphasia, it is reasonable to work him up for stroke/TIA.  Impression: Evaluate for stroke/TIA Evaluate for complex migraine  Recommendations: -Admit to hospitalist -Telemetry monitoring -Allow for permissive hypertension for the first 24-48h - only treat PRN if SBP >220 mmHg. Blood pressures can be gradually normalized to SBP<140 upon discharge. -MRI brain without contrast -CT Angiogram of Head and neck -Echocardiogram -HgbA1c, fasting lipid panel -Frequent neuro checks -Prophylactic therapy-Antiplatelet med: Aspirin - dose 325mg  PO or 300mg  PR -Atorvastatin 80 mg PO daily -Risk factor modification -PT consult, OT consult, Speech consult  Please page stroke NP/PA/MD (listed on AMION)  from 8am-4  pm as this patient will be followed by the stroke team at this point.  -- Milon Dikes, MD [facility] Pager: [phone] If 7pm to 7am, please call on call as listed on AMION.

## 2018-07-04 NOTE — H&P (Signed)
HPI  Timothy Robertson ZOX:[id number] DOB: 1946/08/31 DOA: 07/03/2018  PCP[facility]   Chief Complaint: Stroke  HPI:  72 year old male known history of femoropopliteal bypass, CAD with 2 stents in the remote past, headaches migraines hyperlipidemia, diabetes mellitus type 2 on oral meds hypertension comes in with episodic 30-minute episode this p.m. of confusion-was in the ED 10/17 and had a couple of falls in the past couple of months but came in because of neck pain he states that he came back to the emergency room per his reasoning because of neck pain however his daughter claims that he was confused and know where he who he was where he was and when he stood up he felt very dizzy and they were concerned about stroke His symptoms seem to be left side pain in the neck without radiculopathy  Because of his symptoms and concern for   ED Course: Patient was given medication for nausea and Compazine etc. etc.  Review of Systems:   Negative for fever, visual changes, sore throat, rash, new muscle aches, chest pain, SOB, dysuria, bleeding, n/v/abdominal pain.  Past Medical History:  Diagnosis Date  . Arthritis   . Femoral-femoral bypass graft thrombosis, left (HCC)   . Fibromyalgia   . HOH (hard of hearing)   . Pancreatitis     Past Surgical History:  Procedure Laterality Date  . AORTO-FEMORAL BYPASS GRAFT    . CHOLECYSTECTOMY    . CORONARY ANGIOPLASTY WITH STENT PLACEMENT       reports that he has quit smoking. He has never used smokeless tobacco. He reports that he drank alcohol. He reports that he has current or past drug history. Mobility: Independent at baseline  Allergies  Allergen Reactions  . Penicillins     Has patient had a PCN reaction causing immediate rash, facial/tongue/throat swelling, SOB or lightheadedness with hypotension: Y Has patient had a PCN reaction causing severe rash involving mucus membranes or skin necrosis: Y Has patient had a  PCN reaction that required hospitalization: Y Has patient had a PCN reaction occurring within the last 10 years: N If all of the above answers are "NO", then may proceed with Cephalosporin use.   . Sulfa Antibiotics Itching    Family History  Problem Relation Age of Onset  . Cancer Mother   . Stroke Father      Prior to Admission medications   Medication Sig Start Date End Date Taking? Authorizing Provider  aspirin EC 81 MG tablet Take 81 mg by mouth every other day.   Yes [provider]  atorvastatin (LIPITOR) 80 MG tablet Take 80 mg by mouth daily.   Yes [provider]  Calcium Carb-Cholecalciferol (CALCIUM-VITAMIN D) 600-400 MG-UNIT TABS Take 1 tablet by mouth daily.   Yes [provider]  cetirizine (ZYRTEC ALLERGY) 10 MG tablet Take 10 mg by mouth daily.   Yes [provider]  diclofenac sodium (VOLTAREN) 1 % GEL Apply 2 g topically 4 (four) times daily. 06/29/18  Yes Tilden Fossa, MD  gabapentin (NEURONTIN) 300 MG capsule Take 300-600 mg by mouth See admin instructions. Take 1 capsule (300 mg) in the morning and Take 2 capsules (600 mg) in the evening.    Yes [provider]  glipiZIDE (GLUCOTROL) 10 MG tablet Take 10 mg by mouth daily before breakfast.   Yes [provider]  hydrochlorothiazide (HYDRODIURIL) 25 MG tablet Take 25 mg by mouth daily.   Yes [provider]  Ibuprofen (ADVIL MIGRAINE)  200 MG CAPS Take 400 mg by mouth 2 (two) times daily as needed (pain).   Yes [provider]  lidocaine (LIDODERM) 5 % Place 1 patch onto the skin daily. Remove & Discard patch within 12 hours or as directed by MD 06/29/18  Yes Tilden Fossa, MD  lisinopril (PRINIVIL,ZESTRIL) 40 MG tablet Take 40 mg by mouth daily.   Yes [provider]  metFORMIN (GLUCOPHAGE) 1000 MG tablet Take 1,000 mg by mouth 2 (two) times daily with a meal.   Yes [provider]  metoprolol tartrate (LOPRESSOR) 50 MG  tablet Take 50 mg by mouth 2 (two) times daily.   Yes [provider]  Omega-3 Fatty Acids (FISH OIL) 1000 MG CAPS Take 1 capsule by mouth daily.   Yes [provider]  promethazine (PHENERGAN) 25 MG tablet Take 25 mg by mouth 2 (two) times daily as needed for nausea or vomiting.   Yes [provider]    Physical Exam:  Vitals:   07/04/18 0115 07/04/18 0130  BP: (!) 174/70 (!) 150/66  Pulse: 73 61  Resp: (!) 22 11  Temp:    SpO2: 95% 100%     Awake alert pleasant thick neck no submandibular lymphadenopathy no step-off of the cervical spine power 5/5 able to lift both hands above head smile symmetric finger-nose-finger is identical on both sides no icterus no pallor vision by direct confrontation is normal power is 5/5 lower extremity he is hyperesthetic in his left lower extremity compared to right reflexes are 2/3 chest is clinically clear no added sound S1-S2 no murmur rub or gallop  I have personally reviewed following labs and imaging studies  Labs:   Sodium 138 potassium 3.8 BUN/creatinine 23/1.04 CBC shows white count 11.5 CT of head is negative except for microvascular changes  Medical tests:   EKG independently reviewed: Prolonged PR interval and left bundle branch pattern  Test discussed with performing physician:  n   Decision to obtain old records:   n   Review and summation of old records:   n   Active Problems:   Stroke (cerebrum) (HCC)   TIA (transient ischemic attack)   Assessment/Plan Possible TIA-given duration of symptoms above-hour and close to an hour we will rule out stroke-he is already on aspirin and may need to be on full dose aspirin versus Plavix dependent on MRI findings he is on the stroke pathway--given he has had stents as well as femoropopliteal bypass it is very possible he could have microvascular and small strokes in his brain  Diabetes mellitus type II-continue metformin hold glipizide 10  Hypertension  allow somewhat permissive hypertension continue metoprolol 50 twice daily but holding lisinopril 40 daily holding also HCTZ 25 daily  Hyperlipidemia-continue atorvastatin high-dose 80 mg  Migraines he supposed be on butalbital but has not used the same looks like they are trying to use Compazine for his migraine I suspect he may have some spasm from his fall recently-consider muscle spasm medications if this does not work he is already on gabapentin however which she will continue  Prior CAD and femoropopliteal bypass-continue meds      Severity of Illness: The appropriate patient status for this patient is INPATIENT. Inpatient status is judged to be reasonable and necessary in order to provide the required intensity of service to ensure the patient's safety. The patient's presenting symptoms, physical exam findings, and initial radiographic and laboratory data in the context of their chronic comorbidities is felt to place them  at high risk for further clinical deterioration. Furthermore, it is not anticipated that the patient will be medically stable for discharge from the hospital within 2 midnights of admission. The following factors support the patient status of inpatient.   " The patient's presenting symptoms include tia. " The worrisome physical exam findings include n. " The initial radiographic and laboratory data are worrisome because of n. " The chronic co-morbidities include n.   * I certify that at the point of admission it is my clinical judgment that the patient will require inpatient hospital care spanning beyond 2 midnights from the point of admission due to high intensity of service, high risk for further deterioration and high frequency of surveillance required.*     DVT prophylaxis: Lovenox Code Status: Full code Family Communication: Family Consults called: Neurology consulted by ED  Time spent: 45 minutes  Donnamaria Shands, MD  [facility] Direct contact:  [phone] --Via amion app OR  --[email or web address]; password [email or web address]  7PM-7AM contact night coverage as above  07/04/2018, 2:09 AM

## 2018-07-04 NOTE — Evaluation (Signed)
Speech Language Pathology Evaluation Patient Details Name: Timothy Robertson MRN: [id number] DOB: 17-Feb-1946 Today's Date: 07/04/2018 Time: 1191-4782 SLP Time Calculation (min) (ACUTE ONLY): 14 min  Problem List:  Patient Active Problem List   Diagnosis Date Noted  . 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 TIA (transient ischemic attack) 07/04/2018   Past Medical History:  Past Medical History:  Diagnosis Date  . Arthritis   . Femoral-femoral bypass graft thrombosis, left (HCC)   . Fibromyalgia   . HOH (hard of hearing)   . Pancreatitis    Past Surgical History:  Past Surgical History:  Procedure Laterality Date  . AORTO-FEMORAL BYPASS GRAFT    . CHOLECYSTECTOMY    . CORONARY ANGIOPLASTY WITH STENT PLACEMENT     HPI:  72 year old man past medical history of diabetes, hypertension, coronary artery disease, peripheral vascular disease with a 30-minute 1 hour episode of word finding difficulty/aphasia in the setting of possible headache with a history of migraines .  CT head/MRI negative for acute findings.    Assessment / Plan / Recommendation Clinical Impression  Pt presents with subtle changes in word-retrieval during conversation and word generation tasks.  Confrontation and responsive naming, speech clarity, fluency, command following and yes/no reliability were all WNL.  No SLP f/u is warranted.  Our service will sign off.     SLP Assessment  SLP Recommendation/Assessment: Patient does not need any further Speech Lanaguage Pathology Services    Follow Up Recommendations       Frequency and Duration           SLP Evaluation Cognition  Overall Cognitive Status: Within Functional Limits for tasks assessed Arousal/Alertness: Awake/alert Orientation Level: Oriented X4 Attention: Sustained Sustained Attention: Appears intact Memory: Appears intact       Comprehension  Auditory Comprehension Overall Auditory Comprehension: Appears within functional limits for  tasks assessed Yes/No Questions: Within Functional Limits Commands: Within Functional Limits Visual Recognition/Discrimination Discrimination: Within Function Limits Reading Comprehension Reading Status: Within funtional limits    Expression Expression Primary Mode of Expression: Verbal Verbal Expression Overall Verbal Expression: Appears within functional limits for tasks assessed   Oral / Motor  Oral Motor/Sensory Function Overall Oral Motor/Sensory Function: Within functional limits Motor Speech Overall Motor Speech: Appears within functional limits for tasks assessed   GO                    Blenda Mounts Laurice 07/04/2018, 9:41 AM  Marchelle Folks L. Samson Frederic, MA CCC/SLP Acute Rehabilitation Services Office number [phone] Pager [phone]

## 2018-07-04 NOTE — Progress Notes (Signed)
EEG completed, results pending. 

## 2018-07-04 NOTE — Progress Notes (Signed)
72 year old male known history of femoropopliteal bypass, CAD with 2 stents in the remote past, headaches migraines hyperlipidemia, diabetes mellitus type 2 on oral meds hypertension comes in with episodic 30-minute episode this p.m. of confusion-was in the ED 10/17 and had a couple of falls in the past couple of months but came in because of neck pain he states that he came back to the emergency room per his reasoning because of neck pain however his daughter claims that he was confused and know where he who he was where he was and when he stood up he felt very dizzy and they were concerned about stroke. His symptoms seem to be left side pain in the neck without radiculopathy.  07/04/18: seen with his wife at his bedside. Symptoms improving. Will continue to monitor on telemetry.  Please refer to H&P dictated by my colleague Dr Mahala Menghini on 07/04/18 for further details of the assessment and plan.

## 2018-07-04 NOTE — ED Notes (Signed)
Attempted report and was told by RN to call back because she not available.

## 2018-07-05 ENCOUNTER — Inpatient Hospital Stay (HOSPITAL_BASED_OUTPATIENT_CLINIC_OR_DEPARTMENT_OTHER): Payer: Medicare Other

## 2018-07-05 DIAGNOSIS — I5030 Unspecified diastolic (congestive) heart failure: Secondary | ICD-10-CM | POA: Diagnosis not present

## 2018-07-05 DIAGNOSIS — R4182 Altered mental status, unspecified: Secondary | ICD-10-CM | POA: Diagnosis not present

## 2018-07-05 LAB — ECHOCARDIOGRAM COMPLETE
Height: 68 in
WEIGHTICAEL: 3386.27 [oz_av]

## 2018-07-05 LAB — HEMOGLOBIN A1C
Hgb A1c MFr Bld: 6.3 % — ABNORMAL HIGH (ref 4.8–5.6)
Mean Plasma Glucose: 134 mg/dL

## 2018-07-05 MED ORDER — PERFLUTREN LIPID MICROSPHERE
1.0000 mL | INTRAVENOUS | Status: AC | PRN
  Administered 2018-07-05: 2 mL via INTRAVENOUS
  Filled 2018-07-05: qty 10

## 2018-07-05 MED ORDER — ASPIRIN 325 MG PO TBEC: 325.0000 mg | DELAYED_RELEASE_TABLET | Freq: Every day | ORAL | 0 refills | Status: DC

## 2018-07-05 MED ORDER — SENNOSIDES-DOCUSATE SODIUM 8.6-50 MG PO TABS: 1.0000 | ORAL_TABLET | Freq: Every day | ORAL | Status: DC

## 2018-07-05 NOTE — Evaluation (Signed)
Occupational Therapy Evaluation Patient Details Name: Timothy Robertson MRN: [id number] DOB: [birth year] Today's Date: 07/05/2018    History of Present Illness Pt is 72 year old male admitted to hospital for a 30-minute 1 hour episode of confusion per the family report with possible headache with a history of migraines .  CT head/MRI negative for acute findings.     Clinical Impression   PTA, pt was living with his wife, son, and daughter and performed ADLs, IADLs, and used cane for functional mobility. Pt currently performing ADLs and functional mobility at Mod I level with RW and increased time. Pt presenting near baseline function and demonstrating WFL for cognition, balance, and strength. Recommend dc home once medically stable per physician. All acute OT needs met and will sign off.     Follow Up Recommendations  No OT follow up;Supervision - Intermittent    Equipment Recommendations  None recommended by OT    Recommendations for Other Services       Precautions / Restrictions Precautions Precautions: Fall Restrictions Weight Bearing Restrictions: No      Mobility Bed Mobility               General bed mobility comments: OOB walking with RW upon arrival  Transfers Overall transfer level: Modified independent Equipment used: None Transfers: Sit to/from Stand Sit to Stand: Modified independent (Device/Increase time)         General transfer comment: increased time    Balance Overall balance assessment: No apparent balance deficits (not formally assessed)                                         ADL either performed or assessed with clinical judgement   ADL Overall ADL's : Modified independent                                       General ADL Comments: increased time as needed. Pt demonstrating WFL balance and performing dressing, tub transfer, and functional mobility.      Vision Baseline Vision/History: Wears  glasses Wears Glasses: At all times Patient Visual Report: No change from baseline       Perception     Praxis      Pertinent Vitals/Pain Pain Assessment: No/denies pain     Hand Dominance Right   Extremity/Trunk Assessment Upper Extremity Assessment Upper Extremity Assessment: Overall WFL for tasks assessed   Lower Extremity Assessment Lower Extremity Assessment: Overall WFL for tasks assessed   Cervical / Trunk Assessment Cervical / Trunk Assessment: Normal   Communication Communication Communication: No difficulties   Cognition Arousal/Alertness: Awake/alert Behavior During Therapy: WFL for tasks assessed/performed Overall Cognitive Status: Within Functional Limits for tasks assessed                                     General Comments  Son present throughout session    Exercises     Shoulder Instructions      Home Living Family/patient expects to be discharged to:: Private residence Living Arrangements: Spouse/significant other(Spouse, daughter and son in Sports [employer]) Available Help at Discharge: Family Type of Home: Mobile home Home Access: Stairs to enter Entrance Stairs-Number of Steps: 6 Entrance Stairs-Rails: Left;Right Home Layout: One level  Bathroom Shower/Tub: Teacher, early years/pre: Standard Bathroom Accessibility: Yes   Home Equipment: Environmental consultant - 2 wheels;Cane - single point;Shower seat   Additional Comments: Pt noted steps to enter front and back of house. One rail per set of stairs. Pt notes him and his wife both have DME at home and daughter is the only one actively employed.  Lives With: Spouse;Family    Prior Functioning/Environment Level of Independence: Independent with assistive device(s)  Gait / Transfers Assistance Needed: Patient uses cane for ambulation     Comments: Pt states he cares for his wife at home         OT Problem List: Decreased activity tolerance;Impaired balance (sitting and/or  standing)      OT Treatment/Interventions:      OT Goals(Current goals can be found in the care plan section) Acute Rehab OT Goals Patient Stated Goal: To go home OT Goal Formulation: All assessment and education complete, DC therapy  OT Frequency:     Barriers to D/C:            Co-evaluation              AM-PAC PT "6 Clicks" Daily Activity     Outcome Measure Help from another person eating meals?: None Help from another person taking care of personal grooming?: None Help from another person toileting, which includes using toliet, bedpan, or urinal?: None Help from another person bathing (including washing, rinsing, drying)?: None Help from another person to put on and taking off regular upper body clothing?: None Help from another person to put on and taking off regular lower body clothing?: None 6 Click Score: 24   End of Session Equipment Utilized During Treatment: Rolling walker Nurse Communication: Mobility status  Activity Tolerance: Patient tolerated treatment well Patient left: with call bell/phone within reach;with nursing/sitter in room;with family/visitor present(standing in room with RN)  OT Visit Diagnosis: Other abnormalities of gait and mobility (R26.89)                Time: 1351-1403 OT Time Calculation (min): 12 min Charges:  OT General Charges $OT Visit: 1 Visit OT Evaluation $OT Eval Low Complexity: 1 Low  Avrey Hyser MSOT, OTR/L Acute Rehab Pager: [phone] Office: [place] 07/05/2018, 2:32 PM

## 2018-07-05 NOTE — Care Management Important Message (Signed)
Important Message  Patient Details  Name: Timothy Robertson MRN: [id number] Date of Birth: August 26, 1946   Medicare Important Message Given:       Kermit Balo, RN 07/05/2018, 11:52 AM

## 2018-07-05 NOTE — Progress Notes (Signed)
Echocardiogram 2D Echocardiogram has been performed.  Timothy Robertson 07/05/2018, 11:12 AM

## 2018-07-05 NOTE — Care Management CC44 (Signed)
Condition Code 44 Documentation Completed  Patient Details  Name: Timothy Robertson MRN: [id number] Date of Birth: August 20, 1946   Condition Code 44 given:  Yes Patient signature on Condition Code 44 notice:  Yes Documentation of 2 MD's agreement:  Yes Code 44 added to claim:  Yes    Kermit Balo, RN 07/05/2018, 12:01 PM

## 2018-07-05 NOTE — Care Management Obs Status (Signed)
MEDICARE OBSERVATION STATUS NOTIFICATION   Patient Details  Name: Timothy Robertson MRN: [id number] Date of Birth: 01/08/1946   Medicare Observation Status Notification Given:  Yes    Kermit Balo, RN 07/05/2018, 12:01 PM

## 2018-07-05 NOTE — Care Management Note (Addendum)
Case Management Note  Patient Details  Name: Timothy Robertson MRN: [id number] Date of Birth: 05-06-1946  Subjective/Objective:       Pt in to r/o CVA. He is from home with his spouse, daughter, son and son in law. Pt states he has 24 hour supervision. Pt has been assisting his wife at home recently but has others that can assist when he d/ces home. Pt has: cane, walker, wheelchair, shower seat. Pharmacy: pt receives his meds through the mail from the [place] PCP:  [facility] Pt normally drives when at home but has support if needed for transportation.            Action/Plan: No f/u per PT. CM following for further d/c needs.   Addendum (1410): pt discharging home with self care. No further needs per CM.  Expected Discharge Date:  07/07/18               Expected Discharge Plan:  Home/Self Care  In-House Referral:     Discharge planning Services     Post Acute Care Choice:    Choice offered to:     DME Arranged:    DME Agency:     HH Arranged:    HH Agency:     Status of Service:  In process, will continue to follow  If discussed at Long Length of Stay Meetings, dates discussed:    Additional Comments:  Kermit Balo, RN 07/05/2018, 12:53 PM

## 2018-07-05 NOTE — Plan of Care (Signed)
  Problem: Education: Goal: Knowledge of General Education information will improve Description Including pain rating scale, medication(s)/side effects and non-pharmacologic comfort measures Outcome: Adequate for Discharge   Problem: Health Behavior/Discharge Planning: Goal: Ability to manage health-related needs will improve Outcome: Adequate for Discharge   Problem: Clinical Measurements: Goal: Ability to maintain clinical measurements within normal limits will improve Outcome: Adequate for Discharge Goal: Will remain free from infection Outcome: Adequate for Discharge Goal: Diagnostic test results will improve Outcome: Adequate for Discharge Goal: Respiratory complications will improve Outcome: Adequate for Discharge Goal: Cardiovascular complication will be avoided Outcome: Adequate for Discharge   Problem: Activity: Goal: Risk for activity intolerance will decrease Outcome: Adequate for Discharge   Problem: Nutrition: Goal: Adequate nutrition will be maintained Outcome: Adequate for Discharge   Problem: Coping: Goal: Level of anxiety will decrease Outcome: Adequate for Discharge   Problem: Elimination: Goal: Will not experience complications related to bowel motility Outcome: Adequate for Discharge Goal: Will not experience complications related to urinary retention Outcome: Adequate for Discharge   Problem: Pain Managment: Goal: General experience of comfort will improve Outcome: Adequate for Discharge   Problem: Safety: Goal: Ability to remain free from injury will improve Outcome: Adequate for Discharge   Problem: Skin Integrity: Goal: Risk for impaired skin integrity will decrease Outcome: Adequate for Discharge   Problem: Education: Goal: Knowledge of disease or condition will improve Outcome: Adequate for Discharge Goal: Knowledge of secondary prevention will improve Outcome: Adequate for Discharge Goal: Knowledge of patient specific risk factors  addressed and post discharge goals established will improve Outcome: Adequate for Discharge   Problem: Coping: Goal: Will verbalize positive feelings about self Outcome: Adequate for Discharge   Problem: Health Behavior/Discharge Planning: Goal: Ability to manage health-related needs will improve Outcome: Adequate for Discharge   Problem: Self-Care: Goal: Ability to participate in self-care as condition permits will improve Outcome: Adequate for Discharge Goal: Verbalization of feelings and concerns over difficulty with self-care will improve Outcome: Adequate for Discharge Goal: Ability to communicate needs accurately will improve Outcome: Adequate for Discharge   Problem: Nutrition: Goal: Risk of aspiration will decrease Outcome: Adequate for Discharge   Problem: Ischemic Stroke/TIA Tissue Perfusion: Goal: Complications of ischemic stroke/TIA will be minimized Outcome: Adequate for Discharge

## 2018-07-05 NOTE — Progress Notes (Signed)
Patient discharged home on self with family. Discharge education given to patient/family, all questions answered and concerns addressed. Patient/family verbalized understanding.

## 2018-07-06 NOTE — Discharge Summary (Signed)
Physician Discharge Summary  Timothy Robertson JXB:[id number] DOB: 07/20/1946 DOA: 07/03/2018  PCP: [facility]  Admit date: 07/03/2018 Discharge date: 07/05/2018  Admitted From: Home.  Disposition:  Home.    Recommendations for Outpatient Follow-up:  1. Follow up with PCP in 1-2 weeks 2. Please obtain BMP/CBC in one week 3. Please follow up with cardiology regarding the abnormal echocardiogram.  4. Please follow up with Neurology in 2 weeks.  5. Recommend to continue the topamax for migraine headaches.     Discharge Condition:stable.  CODE STATUS:full code.  Diet recommendation: Heart Healthy   Brief/Interim Summary: 72 year old male known history of femoropopliteal bypass, CAD with 2 stents in the remote past, headaches migraines hyperlipidemia, diabetes mellitus type 2 on oral meds hypertension comes in with episodic 30-minute episode this p.m. of confusion-was in the ED 10/17 and had a couple of falls in the past couple of months .  He was admitted for evaluation of stroke.   Discharge Diagnoses:  Active Problems:   Stroke (cerebrum) (HCC)   TIA (transient ischemic attack)  Migraine headache with slurred speech>  STROKE work up so far negative.  EEG neg for seizures.  Neurology seen the patient recommend to resume topamax. Pt reports he has topamax at home , which will be resumed.   Hypertension:  Well controlled.  Hyperlipidemia:  Resume lipitor.    Type 2 DM:  Diet controlled.    Abnormal echocardiogram with LVEF of 35% and diastolic dysfunction. Recommended to follow up with cardiology in 2 weeks.       Discharge Instructions  Discharge Instructions    Diet - low sodium heart healthy   Complete by:  As directed    Discharge instructions   Complete by:  As directed    Please follow up with cardiology in 2 weeks.  Please follow up with neurology in 6 weeks.     Allergies as of 07/05/2018      Reactions   Penicillins    Has patient  had a PCN reaction causing immediate rash, facial/tongue/throat swelling, SOB or lightheadedness with hypotension: Y Has patient had a PCN reaction causing severe rash involving mucus membranes or skin necrosis: Y Has patient had a PCN reaction that required hospitalization: Y Has patient had a PCN reaction occurring within the last 10 years: N If all of the above answers are "NO", then may proceed with Cephalosporin use.   Sulfa Antibiotics Itching      Medication List    TAKE these medications   ADVIL MIGRAINE 200 MG Caps Generic drug:  Ibuprofen Take 400 mg by mouth 2 (two) times daily as needed (pain).   aspirin 325 MG EC tablet Take 1 tablet (325 mg total) by mouth daily. What changed:    medication strength  how much to take  when to take this   atorvastatin 80 MG tablet Commonly known as:  LIPITOR Take 80 mg by mouth daily.   Calcium-Vitamin D 600-400 MG-UNIT Tabs Take 1 tablet by mouth daily.   diclofenac sodium 1 % Gel Commonly known as:  VOLTAREN Apply 2 g topically 4 (four) times daily.   Fish Oil 1000 MG Caps Take 1 capsule by mouth daily.   gabapentin 300 MG capsule Commonly known as:  NEURONTIN Take 300-600 mg by mouth See admin instructions. Take 1 capsule (300 mg) in the morning and Take 2 capsules (600 mg) in the evening.   glipiZIDE 10 MG tablet Commonly known as:  GLUCOTROL Take 10 mg by  mouth daily before breakfast.   hydrochlorothiazide 25 MG tablet Commonly known as:  HYDRODIURIL Take 25 mg by mouth daily.   lidocaine 5 % Commonly known as:  LIDODERM Place 1 patch onto the skin daily. Remove & Discard patch within 12 hours or as directed by MD   lisinopril 40 MG tablet Commonly known as:  PRINIVIL,ZESTRIL Take 40 mg by mouth daily.   metFORMIN 1000 MG tablet Commonly known as:  GLUCOPHAGE Take 1,000 mg by mouth 2 (two) times daily with a meal.   metoprolol tartrate 50 MG tablet Commonly known as:  LOPRESSOR Take 50 mg by mouth 2  (two) times daily.   promethazine 25 MG tablet Commonly known as:  PHENERGAN Take 25 mg by mouth 2 (two) times daily as needed for nausea or vomiting.   senna-docusate 8.6-50 MG tablet Commonly known as:  Senokot-S Take 1 tablet by mouth at bedtime.   ZYRTEC ALLERGY 10 MG tablet Generic drug:  cetirizine Take 10 mg by mouth daily.      Follow-up Information    [facility] Schedule an appointment as soon as possible for a visit in 1 week(s).   Contact information: [address] [facility] [place] [address] [phone]        [facility]. Schedule an appointment as soon as possible for a visit in 1 week(s).   Specialty:  Cardiology Why:  systolic and dystolic dysfunction.  Contact information: [address], [address] [place] [address] [phone]       [facility]. Schedule an appointment as soon as possible for a visit in 6 week(s).   Contact information: [address]     [address] [place] [address] [phone]         Allergies  Allergen Reactions  . Penicillins     Has patient had a PCN reaction causing immediate rash, facial/tongue/throat swelling, SOB or lightheadedness with hypotension: Y Has patient had a PCN reaction causing severe rash involving mucus membranes or skin necrosis: Y Has patient had a PCN reaction that required hospitalization: Y Has patient had a PCN reaction occurring within the last 10 years: N If all of the above answers are "NO", then may proceed with Cephalosporin use.   . Sulfa Antibiotics Itching    Consultations:  Neurology.    Procedures/Studies: Ct Angio Head W Or Wo Contrast  Result Date: 07/04/2018 CLINICAL DATA:  Slurred speech, assess TIA. EXAM: CT ANGIOGRAPHY HEAD AND NECK TECHNIQUE: Multidetector CT imaging of the head and neck was performed using the standard protocol during bolus administration of  intravenous contrast. Multiplanar CT image reconstructions and MIPs were obtained to evaluate the vascular anatomy. Carotid stenosis measurements (when applicable) are obtained utilizing NASCET criteria, using the distal internal carotid diameter as the denominator. CONTRAST:  50mL ISOVUE-370 IOPAMIDOL (ISOVUE-370) INJECTION 76% COMPARISON:  MRI head July 04, 2018 FINDINGS: CTA NECK FINDINGS: AORTIC ARCH: Normal appearance of the thoracic arch, 2 vessel arch is a normal variant. Moderate intimal thickening calcific atherosclerosis the origins of the innominate, left Common carotid artery and subclavian artery are widely patent. RIGHT CAROTID SYSTEM: Common carotid artery is patent. 50% stenosis RIGHT ICA by NASCET criteria. Patent internal carotid artery. LEFT CAROTID SYSTEM: Common carotid artery is patent, mild intimal thickening. Normal appearance of the carotid bifurcation without hemodynamically significant stenosis by NASCET criteria. Less than 50% stenosis by NASCET criteria. Patent ICA. VERTEBRAL ARTERIES:Codominant vertebral arteries. Patent vertebral arteries with mild calcific  atherosclerosis. SKELETON: No acute osseous process though bone windows have not been submitted. Severe C6-7 degenerative disc resulting in moderate to severe canal stenosis. OTHER NECK: Soft tissues of the neck are nonacute though, not tailored for evaluation. UPPER CHEST: Centrilobular and paraseptal emphysema. Multifocal lung scarring and bronchiectasis. Included heart size is enlarged. At least moderate coronary artery calcifications, incompletely evaluated. No superior mediastinal lymphadenopathy. CTA HEAD FINDINGS: ANTERIOR CIRCULATION: Patent cervical internal carotid arteries, petrous, cavernous and supra clinoid internal carotid arteries. Mild stenosis carotid siphons due to calcific atherosclerosis. Patent anterior communicating artery. Patent anterior and middle cerebral arteries; moderate luminal irregularity  compatible with atherosclerosis. No large vessel occlusion, significant stenosis, contrast extravasation or aneurysm. POSTERIOR CIRCULATION: Patent vertebral arteries, vertebrobasilar junction and basilar artery, as well as main branch vessels. Patent posterior cerebral arteries; moderate luminal irregularity compatible with atherosclerosis. Moderate stenosis RIGHT P1 origin. Robust RIGHT posterior communicating artery present. No large vessel occlusion, significant stenosis, contrast extravasation or aneurysm. VENOUS SINUSES: Major dural venous sinuses are patent though not tailored for evaluation on this angiographic examination. ANATOMIC VARIANTS: None. DELAYED PHASE: No abnormal intracranial enhancement. MIP images reviewed. IMPRESSION: CTA NECK: 1. 50% stenosis RIGHT ICA.  Less than 50% stenosis LEFT ICA. 2. Patent bilateral vertebral arteries. CTA HEAD: 1. No emergent large vessel occlusion or flow-limiting stenosis. 2. Moderate intracranial atherosclerosis. Emphysema (ICD10-J43.9).  Aortic Atherosclerosis (ICD10-I70.0). Electronically Signed   By: Awilda Metro M.D.   On: 07/04/2018 16:10   Dg Chest 2 View  Result Date: 06/29/2018 CLINICAL DATA:  Shortness of breath for 2 days. EXAM: CHEST - 2 VIEW COMPARISON:  None. FINDINGS: Mild cardiomegaly with normal mediastinal contours. Aortic atherosclerosis. Mild interstitial coarsening diffusely. Peripheral reticulation in the upper lung zones suggests scarring. No confluent airspace disease. No pneumothorax or large pleural effusion. No acute osseous abnormalities are seen. IMPRESSION: 1. Mild cardiomegaly with Aortic Atherosclerosis (ICD10-I70.0). 2. Interstitial coarsening of uncertain acuity, this may be chronic or represent atypical infection. Pulmonary edema is felt less likely. 3. Subpleural reticulation in the upper lung zones suggests scarring. Electronically Signed   By: Narda Rutherford M.D.   On: 06/29/2018 21:16   Ct Head Wo  Contrast  Result Date: 07/03/2018 CLINICAL DATA:  TIA.  Altered mental status. EXAM: CT HEAD WITHOUT CONTRAST TECHNIQUE: Contiguous axial images were obtained from the base of the skull through the vertex without intravenous contrast. COMPARISON:  None. FINDINGS: Brain: There is atrophy and chronic small vessel disease changes. No acute intracranial abnormality. Specifically, no hemorrhage, hydrocephalus, mass lesion, acute infarction, or significant intracranial injury. Vascular: No hyperdense vessel or unexpected calcification. Skull: No acute calvarial abnormality. Sinuses/Orbits: No acute findings Other: None IMPRESSION: No acute intracranial abnormality. Atrophy, chronic microvascular disease. Electronically Signed   By: Charlett Nose M.D.   On: 07/03/2018 23:34   Ct Angio Neck W Or Wo Contrast  Result Date: 07/04/2018 CLINICAL DATA:  Slurred speech, assess TIA. EXAM: CT ANGIOGRAPHY HEAD AND NECK TECHNIQUE: Multidetector CT imaging of the head and neck was performed using the standard protocol during bolus administration of intravenous contrast. Multiplanar CT image reconstructions and MIPs were obtained to evaluate the vascular anatomy. Carotid stenosis measurements (when applicable) are obtained utilizing NASCET criteria, using the distal internal carotid diameter as the denominator. CONTRAST:  50mL ISOVUE-370 IOPAMIDOL (ISOVUE-370) INJECTION 76% COMPARISON:  MRI head July 04, 2018 FINDINGS: CTA NECK FINDINGS: AORTIC ARCH: Normal appearance of the thoracic arch, 2 vessel arch is a normal variant. Moderate intimal thickening calcific atherosclerosis the origins  of the innominate, left Common carotid artery and subclavian artery are widely patent. RIGHT CAROTID SYSTEM: Common carotid artery is patent. 50% stenosis RIGHT ICA by NASCET criteria. Patent internal carotid artery. LEFT CAROTID SYSTEM: Common carotid artery is patent, mild intimal thickening. Normal appearance of the carotid bifurcation  without hemodynamically significant stenosis by NASCET criteria. Less than 50% stenosis by NASCET criteria. Patent ICA. VERTEBRAL ARTERIES:Codominant vertebral arteries. Patent vertebral arteries with mild calcific atherosclerosis. SKELETON: No acute osseous process though bone windows have not been submitted. Severe C6-7 degenerative disc resulting in moderate to severe canal stenosis. OTHER NECK: Soft tissues of the neck are nonacute though, not tailored for evaluation. UPPER CHEST: Centrilobular and paraseptal emphysema. Multifocal lung scarring and bronchiectasis. Included heart size is enlarged. At least moderate coronary artery calcifications, incompletely evaluated. No superior mediastinal lymphadenopathy. CTA HEAD FINDINGS: ANTERIOR CIRCULATION: Patent cervical internal carotid arteries, petrous, cavernous and supra clinoid internal carotid arteries. Mild stenosis carotid siphons due to calcific atherosclerosis. Patent anterior communicating artery. Patent anterior and middle cerebral arteries; moderate luminal irregularity compatible with atherosclerosis. No large vessel occlusion, significant stenosis, contrast extravasation or aneurysm. POSTERIOR CIRCULATION: Patent vertebral arteries, vertebrobasilar junction and basilar artery, as well as main branch vessels. Patent posterior cerebral arteries; moderate luminal irregularity compatible with atherosclerosis. Moderate stenosis RIGHT P1 origin. Robust RIGHT posterior communicating artery present. No large vessel occlusion, significant stenosis, contrast extravasation or aneurysm. VENOUS SINUSES: Major dural venous sinuses are patent though not tailored for evaluation on this angiographic examination. ANATOMIC VARIANTS: None. DELAYED PHASE: No abnormal intracranial enhancement. MIP images reviewed. IMPRESSION: CTA NECK: 1. 50% stenosis RIGHT ICA.  Less than 50% stenosis LEFT ICA. 2. Patent bilateral vertebral arteries. CTA HEAD: 1. No emergent large vessel  occlusion or flow-limiting stenosis. 2. Moderate intracranial atherosclerosis. Emphysema (ICD10-J43.9).  Aortic Atherosclerosis (ICD10-I70.0). Electronically Signed   By: Awilda Metro M.D.   On: 07/04/2018 16:10   Ct Cervical Spine Wo Contrast  Result Date: 06/29/2018 CLINICAL DATA:  Posterior neck pain for 3 weeks. EXAM: CT CERVICAL SPINE WITHOUT CONTRAST TECHNIQUE: Multidetector CT imaging of the cervical spine was performed without intravenous contrast. Multiplanar CT image reconstructions were also generated. COMPARISON:  Cervical spine CT 03/17/2018 FINDINGS: Alignment: No static subluxation. Facets are aligned. Occipital condyles and the lateral masses of C1 and C2 are normally approximated. Skull base and vertebrae: No acute fracture. Soft tissues and spinal canal: No prevertebral fluid or swelling. No visible canal hematoma. Disc levels: C2-C3: No spinal canal or foraminal stenosis. C3-C4: No spinal canal stenosis. Mild foraminal narrowing due to uncovertebral hypertrophy. C4-C5: Intermediate disc osteophyte complex with moderate bilateral foraminal narrowing. No spinal canal stenosis. C5-C6: Small disc osteophyte complex without spinal canal stenosis. No neural foraminal stenosis. C6-C7: Large disc osteophyte complex with prominent central component causing severe central spinal canal stenosis. Moderate bilateral neural foraminal stenosis. C7-T1: Unremarkable. T1-T2: Unremarkable. T2-T3: Unremarkable. T3-T4: Unremarkable. Upper chest: No pneumothorax, pulmonary nodule or pleural effusion. Other: Normal visualized paraspinal cervical soft tissues. IMPRESSION: 1. Severe spinal canal and moderate bilateral foraminal stenosis at C6-7 secondary to the presence of large disc osteophyte complex. 2. Moderate bilateral C4-5 neural foraminal stenosis. 3. No acute fracture or static subluxation of the cervical spine. Electronically Signed   By: Deatra Robinson M.D.   On: 06/29/2018 21:24   Mr Brain Wo  Contrast  Result Date: 07/04/2018 CLINICAL DATA:  Speech difficulty and headache EXAM: MRI HEAD WITHOUT CONTRAST TECHNIQUE: Multiplanar, multiecho pulse sequences of the brain and surrounding structures were  obtained without intravenous contrast. COMPARISON:  Head CT from yesterday FINDINGS: Brain: No acute infarction, hemorrhage, hydrocephalus, extra-axial collection or mass lesion. Mild FLAIR hyperintensity in the cerebral white matter attributed to chronic small vessel ischemia. There has been a discrete remote infarct in the bifrontal white matter, with band of gliosis crossing the corpus callosum. Vascular: Major flow voids are preserved Skull and upper cervical spine: No evidence of marrow lesion Sinuses/Orbits: Mild mucosal thickening with retention cysts in the right maxillary sinus. IMPRESSION: 1. No acute or reversible finding. 2. Chronic small vessel ischemia in the cerebral white matter, overall mild. Electronically Signed   By: Marnee Spring M.D.   On: 07/04/2018 08:51       Subjective: No chest pain or sob, neck pain resolved.  No headache or dizziness.   Discharge Exam: Vitals:   07/05/18 0705 07/05/18 1305  BP: (!) 136/58 (!) 160/87  Pulse:  62  Resp: 18 16  Temp: 98.9 F (37.2 C) 98.9 F (37.2 C)  SpO2: 97% 97%   Vitals:   07/05/18 0400 07/05/18 0425 07/05/18 0705 07/05/18 1305  BP:  (!) 121/51 (!) 136/58 (!) 160/87  Pulse:  (!) 55  62  Resp: 19  18 16   Temp:  (!) 97.5 F (36.4 C) 98.9 F (37.2 C) 98.9 F (37.2 C)  TempSrc:  Oral Oral Oral  SpO2:  94% 97% 97%  Weight:      Height:        General: Pt is alert, awake, not in acute distress Cardiovascular: RRR, S1/S2 +, no rubs, no gallops Respiratory: CTA bilaterally, no wheezing, no rhonchi Abdominal: Soft, NT, ND, bowel sounds + Extremities: no edema, no cyanosis    The results of significant diagnostics from this hospitalization (including imaging, microbiology, ancillary and laboratory) are listed  below for reference.     Microbiology: No results found for this or any previous visit (from the past 240 hour(s)).   Labs: BNP (last 3 results) No results for input(s): BNP in the last 8760 hours. Basic Metabolic Panel: Recent Labs  Lab 06/29/18 2047 07/03/18 2230 07/03/18 2241 07/04/18 0235  NA 139 138 141  --   K 3.9 3.8 4.0  --   CL 108 105 107  --   CO2 23 22  --   --   GLUCOSE 100* 111* 110*  --   BUN 17 23 30*  --   CREATININE 0.83 1.04 1.00 1.00  CALCIUM 9.5 9.6  --   --    Liver Function Tests: Recent Labs  Lab 07/03/18 2230  AST 27  ALT 23  ALKPHOS 61  BILITOT 0.8  PROT 6.8  ALBUMIN 3.6   No results for input(s): LIPASE, AMYLASE in the last 168 hours. No results for input(s): AMMONIA in the last 168 hours. CBC: Recent Labs  Lab 06/29/18 2047 07/03/18 2230 07/03/18 2241 07/04/18 0235  WBC 9.2 11.5*  --  10.0  NEUTROABS 4.6 6.8  --   --   HGB 15.5 15.9 15.6 14.1  HCT 48.2 48.2 46.0 43.4  MCV 93.4 91.3  --  92.5  PLT 216 258  --  227   Cardiac Enzymes: No results for input(s): CKTOTAL, CKMB, CKMBINDEX, TROPONINI in the last 168 hours. BNP: Invalid input(s): POCBNP CBG: Recent Labs  Lab 07/03/18 2226 07/04/18 0950  GLUCAP 107* 153*   D-Dimer No results for input(s): DDIMER in the last 72 hours. Hgb A1c Recent Labs    07/04/18 0235  HGBA1C 6.3*  Lipid Profile Recent Labs    07/04/18 0235  CHOL 117  HDL 23*  LDLCALC 65  TRIG 161  CHOLHDL 5.1   Thyroid function studies No results for input(s): TSH, T4TOTAL, T3FREE, THYROIDAB in the last 72 hours.  Invalid input(s): FREET3 Anemia work up No results for input(s): VITAMINB12, FOLATE, FERRITIN, TIBC, IRON, RETICCTPCT in the last 72 hours. Urinalysis No results found for: COLORURINE, APPEARANCEUR, LABSPEC, PHURINE, GLUCOSEU, HGBUR, BILIRUBINUR, KETONESUR, PROTEINUR, UROBILINOGEN, NITRITE, LEUKOCYTESUR Sepsis Labs Invalid input(s): PROCALCITONIN,  WBC,   LACTICIDVEN Microbiology No results found for this or any previous visit (from the past 240 hour(s)).   Time coordinating discharge: 32 minutes  SIGNED:   Kathlen Mody, MD  [facility] 07/06/2018, 8:03 AM Pager   If 7PM-7AM, please contact night-coverage [email or web address] Password [email or web address]

## 2018-07-13 DIAGNOSIS — I420 Dilated cardiomyopathy: Secondary | ICD-10-CM | POA: Insufficient documentation

## 2018-07-13 DIAGNOSIS — I447 Left bundle-branch block, unspecified: Secondary | ICD-10-CM | POA: Insufficient documentation

## 2018-07-13 NOTE — Progress Notes (Signed)
Cardiology Office Note:    Date:  07/14/2018   ID:  Timothy Robertson, DOB [birth year], MRN [id number]  PCP:  [facility]  Cardiologist:  Norman Herrlich, MD   Referring MD: [facility]  ASSESSMENT:    1. Dilated cardiomyopathy (HCC)   2. LBBB (left bundle branch block)   3. Coronary artery disease of native artery of native heart with stable angina pectoris (HCC)   4. Hyperlipidemia, unspecified hyperlipidemia type   5. Type 2 diabetes mellitus with complication, without long-term current use of insulin (HCC)   6. Leg swelling    PLAN:    In order of problems listed above:  1. Immediate clinical problem is his ischemic cardiomyopathy impossible to define if this is new or old stable or progressive and whether is related more to his left bundle branch was coronary artery disease.  Regardless we will intensify and streamline his medical therapy switching to carvedilol and Entresto check a proBNP level as a baseline and see back in the office uptitrating continue spironolactone will check renal function proBNP is a baseline in 1 week.  He will need ischemia evaluation I will schedule him but he told me he may be a cardiology follow-up care through the [facility] hospital with their cardiologist.  Asked him to contact my office if that is his decision. 2. We will need to consider the merits of CRT and ICD after optimal medical therapy and a follow-up echocardiogram are gated pool study. 3. He requires an ischemia evaluation ordered and continue current treatment he has not had any angina and his present medical regimen 4. Continue his high intensity statin.  His most recent lipid profile is optimal 5. Stable managed by PCP for an additional drug as needed SGLT2 inhibitor especially DAPA would be ideal 6. Check proBNP level at this time not on a loop diuretic  Next appointment 4 weeks at me and less he makes a decision follow-up with the [facility]   Medication  Adjustments/Labs and Tests Ordered: Current medicines are reviewed at length with the patient today.  Concerns regarding medicines are outlined above.  Orders Placed This Encounter  Procedures  . Basic Metabolic Panel (BMET)  . Pro b natriuretic peptide (BNP)  . MYOCARDIAL PERFUSION IMAGING   Meds ordered this encounter  Medications  . carvedilol (COREG) 6.25 MG tablet    Sig: Take 1 tablet (6.25 mg total) by mouth 2 (two) times daily with a meal.    Dispense:  60 tablet    Refill:  3  . DISCONTD: sacubitril-valsartan (ENTRESTO) 24-26 MG    Sig: Take 1 tablet by mouth 2 (two) times daily.    Dispense:  60 tablet    Refill:  3  . sacubitril-valsartan (ENTRESTO) 24-26 MG    Sig: Take 1 tablet by mouth 2 (two) times daily.    Dispense:  60 tablet    Refill:  3    Please Honor Card patient is presenting for Timothy Robertson: [id number]; Timothy Robertson: [id number]; RXCPN: [id number]; ISSUER: [id number] ID: [id number]     No chief complaint on file.   History of Present Illness:    Timothy Robertson is a 72 y.o. male with a history of CAD with PCI and PAD who is being seen today for the evaluation of an abnormal echo during an admission for TIA at the request of [facility].  He has a history of myocardial infarction 20 years ago PCI and 2 stents and surprisingly has had no cardiology  follow-up or interaction since that time.  He is unaware of whether he had cardiac injury initially.  He recently admitted to the hospital with a TIA echocardiogram done ejection fraction is severely reduced 30 to 35% with left bundle branch block.  He has had intermittent edema in the left leg after revascularization no orthopnea chest pain palpitation or syncope and when he walks a longer distance such as 2 miles or uses upper extremities assist in his wife in a wheelchair he notices he is breathless but is not severe and it does not disrupt his life and the pattern is unchanged.  Medical therapy includes beta-blocker and  ACE inhibitor.  He is on his way at the [facility] today for an evaluation he will discuss with him whether he gets ongoing cardiology care at [facility] but at this time we will stop his beta-blocker and put him on carvedilol stop his ACE inhibitor place him on Entresto and make arrangements for an ischemia evaluation with a Lexiscan myocardial perfusion study.  If he has evidence of ischemia to require coronary angiography.  Otherwise will uptitrate recheck renal function and get him on optimal medical therapy before decision regarding the merits of CRT pacemaker.  He has not had syncope.  Echo 07/05/18: Study Conclusions - Left ventricle: The cavity size was normal. Wall thickness was   normal. Systolic function was moderately to severely reduced. The   estimated ejection fraction was in the range of 30% to 35%.   Diffuse hypokinesis. Features are consistent with a pseudonormal   left ventricular filling pattern, with concomitant abnormal   relaxation and increased filling pressure (grade 2 diastolic   dysfunction). - Mitral valve: Mildly calcified annulus. There was moderate   regurgitation. - Left atrium: The atrium was moderately dilated.  EKG 07/06/18 [facility] LBBB LAE and first degree AVB  Past Medical History:  Diagnosis Date  . Arthritis   . Femoral-femoral bypass graft thrombosis, left (HCC)   . Fibromyalgia   . HOH (hard of hearing)   . Pancreatitis     Past Surgical History:  Procedure Laterality Date  . AORTO-FEMORAL BYPASS GRAFT    . CHOLECYSTECTOMY    . CORONARY ANGIOPLASTY WITH STENT PLACEMENT      Current Medications: Current Meds  Medication Sig  . aspirin EC 325 MG EC tablet Take 1 tablet (325 mg total) by mouth daily.  Marland Kitchen atorvastatin (LIPITOR) 80 MG tablet Take 80 mg by mouth daily.  . Calcium Carb-Cholecalciferol (CALCIUM-VITAMIN D) 600-400 MG-UNIT TABS Take 1 tablet by mouth daily.  . cetirizine (ZYRTEC ALLERGY) 10 MG tablet Take 10 mg by mouth daily.  Marland Kitchen  glipiZIDE (GLUCOTROL) 10 MG tablet Take 10 mg by mouth daily before breakfast.  . hydrochlorothiazide (HYDRODIURIL) 25 MG tablet Take 25 mg by mouth daily.  . metFORMIN (GLUCOPHAGE) 1000 MG tablet Take 1,000 mg by mouth 2 (two) times daily with a meal.  . Omega-3 Fatty Acids (FISH OIL) 1000 MG CAPS Take 1 capsule by mouth daily.  . promethazine (PHENERGAN) 25 MG tablet Take 25 mg by mouth 2 (two) times daily as needed for nausea or vomiting.  . [DISCONTINUED] lisinopril (PRINIVIL,ZESTRIL) 40 MG tablet Take 40 mg by mouth daily.  . [DISCONTINUED] metoprolol tartrate (LOPRESSOR) 50 MG tablet Take 50 mg by mouth 2 (two) times daily.     Allergies:   Penicillins and Sulfa antibiotics   Social History   Socioeconomic History  . Marital status: Married    Spouse name: Not on  file  . Number of children: Not on file  . Years of education: Not on file  . Highest education level: Not on file  Occupational History  . Not on file  Social Needs  . Financial resource strain: Not on file  . Food insecurity:    Worry: Not on file    Inability: Not on file  . Transportation needs:    Medical: Not on file    Non-medical: Not on file  Tobacco Use  . Smoking status: Former Smoker    Packs/day: 1.00    Years: 40.00    Pack years: 40.00    Types: Cigarettes    Last attempt to quit: 1995    Years since quitting: 24.8  . Smokeless tobacco: Never Used  Substance and Sexual Activity  . Alcohol use: Not Currently  . Drug use: Not Currently  . Sexual activity: Not on file  Lifestyle  . Physical activity:    Days per week: Not on file    Minutes per session: Not on file  . Stress: Not on file  Relationships  . Social connections:    Talks on phone: Not on file    Gets together: Not on file    Attends religious service: Not on file    Active member of club or organization: Not on file    Attends meetings of clubs or organizations: Not on file    Relationship status: Not on file  Other Topics  Concern  . Not on file  Social History Narrative  . Not on file     Family History: The patient's family history includes Cancer in his mother; Stroke in his father.  ROS:   Review of Systems  Constitution: Positive for malaise/fatigue.  HENT: Negative.   Eyes: Negative.   Cardiovascular: Positive for dyspnea on exertion and leg swelling.  Respiratory: Positive for shortness of breath.   Endocrine: Negative.   Hematologic/Lymphatic: Negative.   Skin: Negative.   Musculoskeletal: Negative.   Gastrointestinal: Negative.   Genitourinary: Negative.   Neurological: Negative.   Psychiatric/Behavioral: Negative.   Allergic/Immunologic: Negative.    Please see the history of present illness.     All other systems reviewed and are negative.  EKGs/Labs/Other Studies Reviewed:    The following studies were reviewed today:   EKG: Hospital EKG independently reviewed sinus rhythm left bundle branch block  Recent Labs: 07/03/2018: ALT 23; BUN 30; Potassium 4.0; Sodium 141 07/04/2018: Creatinine, Ser 1.00; Hemoglobin 14.1; Platelets 227  Recent Lipid Panel    Component Value Date/Time   CHOL 117 07/04/2018 0235   TRIG 147 07/04/2018 0235   HDL 23 (L) 07/04/2018 0235   CHOLHDL 5.1 07/04/2018 0235   VLDL 29 07/04/2018 0235   LDLCALC 65 07/04/2018 0235    Physical Exam:    VS:  BP 126/72 (BP Location: Left Arm, Patient Position: Sitting, Cuff Size: Normal)   Pulse (!) 56   Ht 5\' 8"  (1.727 m)   Wt 202 lb (91.6 kg)   SpO2 98%   BMI 30.71 kg/m     Wt Readings from Last 3 Encounters:  07/14/18 202 lb (91.6 kg)  07/03/18 211 lb 10.3 oz (96 kg)  06/29/18 212 lb (96.2 kg)     GEN:  Well nourished, well developed in no acute distress HEENT: Normal NECK: No JVD; No carotid bruits LYMPHATICS: No lymphadenopathy CARDIAC: Paradoxical S2  Soft S1 RRR, no murmurs, rubs, gallops RESPIRATORY:  Clear to auscultation without rales, wheezing or rhonchi  ABDOMEN: Soft, non-tender,  non-distended MUSCULOSKELETAL:  No edema; No deformity  SKIN: Warm and dry NEUROLOGIC:  Alert and oriented x 3 PSYCHIATRIC:  Normal affect     Signed, Norman Herrlich, MD  07/14/2018 9:57 AM    [facility]

## 2018-07-14 ENCOUNTER — Encounter: Payer: Self-pay | Admitting: Cardiology

## 2018-07-14 ENCOUNTER — Ambulatory Visit: Payer: Medicare Other | Admitting: Cardiology

## 2018-07-14 ENCOUNTER — Encounter: Payer: Self-pay | Admitting: *Deleted

## 2018-07-14 VITALS — BP 126/72 | HR 56 | Ht 68.0 in | Wt 202.0 lb

## 2018-07-14 DIAGNOSIS — I2510 Atherosclerotic heart disease of native coronary artery without angina pectoris: Secondary | ICD-10-CM | POA: Insufficient documentation

## 2018-07-14 DIAGNOSIS — I25118 Atherosclerotic heart disease of native coronary artery with other forms of angina pectoris: Secondary | ICD-10-CM | POA: Diagnosis not present

## 2018-07-14 DIAGNOSIS — I420 Dilated cardiomyopathy: Secondary | ICD-10-CM | POA: Diagnosis not present

## 2018-07-14 DIAGNOSIS — I447 Left bundle-branch block, unspecified: Secondary | ICD-10-CM

## 2018-07-14 DIAGNOSIS — M7989 Other specified soft tissue disorders: Secondary | ICD-10-CM

## 2018-07-14 DIAGNOSIS — E785 Hyperlipidemia, unspecified: Secondary | ICD-10-CM

## 2018-07-14 DIAGNOSIS — E118 Type 2 diabetes mellitus with unspecified complications: Secondary | ICD-10-CM

## 2018-07-14 MED ORDER — CARVEDILOL 6.25 MG PO TABS: 6.2500 mg | ORAL_TABLET | Freq: Two times a day (BID) | ORAL | 3 refills | Status: DC

## 2018-07-14 MED ORDER — SACUBITRIL-VALSARTAN 24-26 MG PO TABS: 1.0000 | ORAL_TABLET | Freq: Two times a day (BID) | ORAL | 3 refills | Status: DC

## 2018-07-14 NOTE — Patient Instructions (Addendum)
Medication Instructions:  Your physician has recommended you make the following change in your medication:   STOP metoprolol STOP lisinopril  START carvedilol (coreg) 6.25 mg: Take 1 tablet twice daily  START sacubitril-valsartan (entresto) 24-26 mg: Take 1 tablet twice daily STARTING Tuesday, 07/18/18   If you need a refill on your cardiac medications before your next appointment, please call your pharmacy.   Lab work: Your physician recommends that you return for lab work in 1 week: BMP, ProBNP.   If you have labs (blood work) drawn today and your tests are completely normal, you will receive your results only by: Marland Kitchen MyChart Message (if you have MyChart) OR . A paper copy in the mail If you have any lab test that is abnormal or we need to change your treatment, we will call you to review the results.  Testing/Procedures: Your physician has requested that you have a lexiscan myoview. For further information please visit [email or web address] Please follow instruction sheet, as given.   Follow-Up: At [facility], you and your health needs are our priority.  As part of our continuing mission to provide you with exceptional heart care, we have created designated Provider Care Teams.  These Care Teams include your primary Cardiologist (physician) and Advanced Practice Providers (APPs -  Physician Assistants and Nurse Practitioners) who all work together to provide you with the care you need, when you need it. You will need a follow up appointment in 4 weeks.      Heart Failure  Weigh yourself every morning when you first wake up and record on a calender or note pad, bring this to your office visits. Using a pill tender can help with taking your medications consistently.  Limit your fluid intake to 2 liters daily  Limit your sodium intake to less than 2-3 grams daily. Ask if you need dietary teaching.  If you gain more than 3 pounds (from your dry weight ), double your dose of  diuretic for the day.  If you gain more than 5 pounds (from your dry weight), double your dose of lasix and call your heart failure doctor.  Please do not smoke tobacco since it is very bad for your heart.  Please do not drink alcohol since it can worsen your heart failure.Also avoid OTC nonsteroidal drugs, such as advil, aleve and motrin.  Try to exercise for at least 30 minutes every day because this will help your heart be more efficient. You may be eligible for supervised cardiac rehab, ask your physician.    Carvedilol tablets What is this medicine? CARVEDILOL (KAR ve dil ol) is a beta-blocker. Beta-blockers reduce the workload on the heart and help it to beat more regularly. This medicine is used to treat high blood pressure and heart failure. This medicine may be used for other purposes; ask your health care provider or pharmacist if you have questions. COMMON BRAND NAME(S): Coreg What should I tell my health care provider before I take this medicine? They need to know if you have any of these conditions: -circulation problems -diabetes -history of heart attack or heart disease -liver disease -lung or breathing disease, like asthma or emphysema -pheochromocytoma -slow or irregular heartbeat -thyroid disease -an unusual or allergic reaction to carvedilol, other beta-blockers, medicines, foods, dyes, or preservatives -pregnant or trying to get pregnant -breast-feeding How should I use this medicine? Take this medicine by mouth with a glass of water. Follow the directions on the prescription label. It is best to take the tablets  with food. Take your doses at regular intervals. Do not take your medicine more often than directed. Do not stop taking except on the advice of your doctor or health care professional. Talk to your pediatrician regarding the use of this medicine in children. Special care may be needed. Overdosage: If you think you have taken too much of this medicine  contact a poison control center or emergency room at once. NOTE: This medicine is only for you. Do not share this medicine with others. What if I miss a dose? If you miss a dose, take it as soon as you can. If it is almost time for your next dose, take only that dose. Do not take double or extra doses. What may interact with this medicine? This medicine may interact with the following medications: -certain medicines for blood pressure, heart disease, irregular heart beat -certain medicines for depression, like fluoxetine or paroxetine -certain medicines for diabetes, like glipizide or glyburide -cimetidine -clonidine -cyclosporine -digoxin -MAOIs like Carbex, Eldepryl, Marplan, Nardil, and Parnate -reserpine -rifampin This list may not describe all possible interactions. Give your health care provider a list of all the medicines, herbs, non-prescription drugs, or dietary supplements you use. Also tell them if you smoke, drink alcohol, or use illegal drugs. Some items may interact with your medicine. What should I watch for while using this medicine? Check your heart rate and blood pressure regularly while you are taking this medicine. Ask your doctor or health care professional what your heart rate and blood pressure should be, and when you should contact him or her. Do not stop taking this medicine suddenly. This could lead to serious heart-related effects. Contact your doctor or health care professional if you have difficulty breathing while taking this drug. Check your weight daily. Ask your doctor or health care professional when you should notify him/her of any weight gain. You may get drowsy or dizzy. Do not drive, use machinery, or do anything that requires mental alertness until you know how this medicine affects you. To reduce the risk of dizzy or fainting spells, do not sit or stand up quickly. Alcohol can make you more drowsy, and increase flushing and rapid heartbeats. Avoid  alcoholic drinks. If you have diabetes, check your blood sugar as directed. Tell your doctor if you have changes in your blood sugar while you are taking this medicine. If you are going to have surgery, tell your doctor or health care professional that you are taking this medicine. What side effects may I notice from receiving this medicine? Side effects that you should report to your doctor or health care professional as soon as possible: -allergic reactions like skin rash, itching or hives, swelling of the face, lips, or tongue -breathing problems -dark urine -irregular heartbeat -swollen legs or ankles -vomiting -yellowing of the eyes or skin Side effects that usually do not require medical attention (report to your doctor or health care professional if they continue or are bothersome): -change in sex drive or performance -diarrhea -dry eyes (especially if wearing contact lenses) -dry, itching skin -headache -nausea -unusually tired This list may not describe all possible side effects. Call your doctor for medical advice about side effects. You may report side effects to FDA at 1-800-FDA-1088. Where should I keep my medicine? Keep out of the reach of children. Store at room temperature below 30 degrees C (86 degrees F). Protect from moisture. Keep container tightly closed. Throw away any unused medicine after the expiration date. NOTE: This sheet is  a summary. It may not cover all possible information. If you have questions about this medicine, talk to your doctor, pharmacist, or health care provider.  2018 Elsevier/Gold Standard (2013-05-06 14:12:02)    Sacubitril; Valsartan oral tablet What is this medicine? SACUBITRIL; VALSARTAN (sak UE bi tril; val SAR tan) is a combination of 2 drugs used to reduce the risk of death and hospitalizations in people with long-lasting heart failure. It is usually used with other medicines to treat heart failure. This medicine may be used for  other purposes; ask your health care provider or pharmacist if you have questions. COMMON BRAND NAME(S): Entresto What should I tell my health care provider before I take this medicine? They need to know if you have any of these conditions: -diabetes and take a medicine that contains aliskiren -kidney disease -liver disease -an unusual or allergic reaction to sacubitril; valsartan, drugs called angiotensin converting enzyme (ACE) inhibitors, angiotensin II receptor blockers (ARBs), other medicines, foods, dyes, or preservatives -pregnant or trying to get pregnant -breast-feeding How should I use this medicine? Take this medicine by mouth with a glass of water. Follow the directions on the prescription label. You can take it with or without food. If it upsets your stomach, take it with food. Take your medicine at regular intervals. Do not take it more often than directed. Do not stop taking except on your doctor's advice. Do not take this medicine for at least 36 hours before or after you take an ACE inhibitor medicine. Talk to your health care provider if you are not sure if you take an ACE inhibitor. Talk to your pediatrician regarding the use of this medicine in children. Special care may be needed. Overdosage: If you think you have taken too much of this medicine contact a poison control center or emergency room at once. NOTE: This medicine is only for you. Do not share this medicine with others. What if I miss a dose? If you miss a dose, take it as soon as you can. If it is almost time for next dose, take only that dose. Do not take double or extra doses. What may interact with this medicine? Do not take this medicine with any of the following medicines: -aliskiren if you have diabetes -angiotensin-converting enzyme (ACE) inhibitors, like benazepril, captopril, enalapril, fosinopril, lisinopril, or ramipril This medicine may also interact with the following medicines: -angiotensin II  receptor blockers (ARBs) like azilsartan, candesartan, eprosartan, irbesartan, losartan, olmesartan, telmisartan, or valsartan -lithium -NSAIDS, medicines for pain and inflammation, like ibuprofen or naproxen -potassium-sparing diuretics like amiloride, spironolactone, and triamterene -potassium supplements This list may not describe all possible interactions. Give your health care provider a list of all the medicines, herbs, non-prescription drugs, or dietary supplements you use. Also tell them if you smoke, drink alcohol, or use illegal drugs. Some items may interact with your medicine. What should I watch for while using this medicine? Tell your doctor or healthcare professional if your symptoms do not start to get better or if they get worse. Do not become pregnant while taking this medicine. Women should inform their doctor if they wish to become pregnant or think they might be pregnant. There is a potential for serious side effects to an unborn child. Talk to your health care professional or pharmacist for more information. You may get dizzy. Do not drive, use machinery, or do anything that needs mental alertness until you know how this medicine affects you. Do not stand or sit up quickly, especially if  you are an older patient. This reduces the risk of dizzy or fainting spells. Avoid alcoholic drinks; they can make you more dizzy. What side effects may I notice from receiving this medicine? Side effects that you should report to your doctor or health care professional as soon as possible: -allergic reactions like skin rash, itching or hives, swelling of the face, lips, or tongue -signs and symptoms of increased potassium like muscle weakness; chest pain; or fast, irregular heartbeat -signs and symptoms of kidney injury like trouble passing urine or change in the amount of urine -signs and symptoms of low blood pressure like feeling dizzy or lightheaded, or if you develop extreme fatigue Side  effects that usually do not require medical attention (report to your doctor or health care professional if they continue or are bothersome): -cough This list may not describe all possible side effects. Call your doctor for medical advice about side effects. You may report side effects to FDA at 1-800-FDA-1088. Where should I keep my medicine? Keep out of the reach of children. Store at room temperature between 15 and 30 degrees C (59 and 86 degrees F). Throw away any unused medicine after the expiration date. NOTE: This sheet is a summary. It may not cover all possible information. If you have questions about this medicine, talk to your doctor, pharmacist, or health care provider.  2018 Elsevier/Gold Standard (2015-10-15 13:54:19)    Cardiac Nuclear Scan A cardiac nuclear scan is a test that measures blood flow to the heart when a person is resting and when he or she is exercising. The test looks for problems such as:  Not enough blood reaching a portion of the heart.  The heart muscle not working normally.  You may need this test if:  You have heart disease.  You have had abnormal lab results.  You have had heart surgery or angioplasty.  You have chest pain.  You have shortness of breath.  In this test, a radioactive dye (tracer) is injected into your bloodstream. After the tracer has traveled to your heart, an imaging device is used to measure how much of the tracer is absorbed by or distributed to various areas of your heart. This procedure is usually done at a hospital and takes 2-4 hours. Tell a health care provider about:  Any allergies you have.  All medicines you are taking, including vitamins, herbs, eye drops, creams, and over-the-counter medicines.  Any problems you or family members have had with the use of anesthetic medicines.  Any blood disorders you have.  Any surgeries you have had.  Any medical conditions you have.  Whether you are pregnant or may be  pregnant. What are the risks? Generally, this is a safe procedure. However, problems may occur, including:  Serious chest pain and heart attack. This is only a risk if the stress portion of the test is done.  Rapid heartbeat.  Sensation of warmth in your chest. This usually passes quickly.  What happens before the procedure?  Ask your health care provider about changing or stopping your regular medicines. This is especially important if you are taking diabetes medicines or blood thinners.  Remove your jewelry on the day of the procedure. What happens during the procedure?  An IV tube will be inserted into one of your veins.  Your health care provider will inject a small amount of radioactive tracer through the tube.  You will wait for 20-40 minutes while the tracer travels through your bloodstream.  Your heart  activity will be monitored with an electrocardiogram (ECG).  You will lie down on an exam table.  Images of your heart will be taken for about 15-20 minutes.  You may be asked to exercise on a treadmill or stationary bike. While you exercise, your heart's activity will be monitored with an ECG, and your blood pressure will be checked. If you are unable to exercise, you may be given a medicine to increase blood flow to parts of your heart.  When blood flow to your heart has peaked, a tracer will again be injected through the IV tube.  After 20-40 minutes, you will get back on the exam table and have more images taken of your heart.  When the procedure is over, your IV tube will be removed. The procedure may vary among health care providers and hospitals. Depending on the type of tracer used, scans may need to be repeated 3-4 hours later. What happens after the procedure?  Unless your health care provider tells you otherwise, you may return to your normal schedule, including diet, activities, and medicines.  Unless your health care provider tells you otherwise, you may  increase your fluid intake. This will help flush the contrast dye from your body. Drink enough fluid to keep your urine clear or pale yellow.  It is up to you to get your test results. Ask your health care provider, or the department that is doing the test, when your results will be ready. Summary  A cardiac nuclear scan measures the blood flow to the heart when a person is resting and when he or she is exercising.  You may need this test if you are at risk for heart disease.  Tell your health care provider if you are pregnant.  Unless your health care provider tells you otherwise, increase your fluid intake. This will help flush the contrast dye from your body. Drink enough fluid to keep your urine clear or pale yellow. This information is not intended to replace advice given to you by your health care provider. Make sure you discuss any questions you have with your health care provider. Document Released: 09/24/2004 Document Revised: 09/01/2016 Document Reviewed: 08/08/2013 Elsevier Interactive Patient Education  2017 [employer].

## 2018-07-20 ENCOUNTER — Telehealth (HOSPITAL_COMMUNITY): Payer: Self-pay | Admitting: *Deleted

## 2018-07-20 NOTE — Telephone Encounter (Signed)
Left message on voicemail in reference to upcoming appointment scheduled for 07/25/18. Phone number given for a call back so details instructions can be given. Timothy Robertson   

## 2018-07-20 NOTE — Telephone Encounter (Signed)
Patient given detailed instructions per Myocardial Perfusion Study Information Sheet for the test on 07/25/18 at 7:45. Patient notified to arrive 15 minutes early and that it is imperative to arrive on time for appointment to keep from having the test rescheduled.  If you need to cancel or reschedule your appointment, please call the office within 24 hours of your appointment. . Patient verbalized understanding.Timothy Robertson

## 2018-07-25 ENCOUNTER — Other Ambulatory Visit: Payer: Medicare Other

## 2018-07-25 ENCOUNTER — Ambulatory Visit (HOSPITAL_COMMUNITY): Payer: Medicare Other | Attending: Cardiovascular Disease

## 2018-07-25 VITALS — Ht 68.0 in | Wt 202.0 lb

## 2018-07-25 DIAGNOSIS — I420 Dilated cardiomyopathy: Secondary | ICD-10-CM | POA: Diagnosis not present

## 2018-07-25 DIAGNOSIS — I447 Left bundle-branch block, unspecified: Secondary | ICD-10-CM | POA: Insufficient documentation

## 2018-07-25 DIAGNOSIS — I25118 Atherosclerotic heart disease of native coronary artery with other forms of angina pectoris: Secondary | ICD-10-CM | POA: Insufficient documentation

## 2018-07-25 DIAGNOSIS — E118 Type 2 diabetes mellitus with unspecified complications: Secondary | ICD-10-CM | POA: Insufficient documentation

## 2018-07-25 DIAGNOSIS — E785 Hyperlipidemia, unspecified: Secondary | ICD-10-CM | POA: Insufficient documentation

## 2018-07-25 LAB — MYOCARDIAL PERFUSION IMAGING
CHL CUP NUCLEAR SRS: 8
CHL CUP NUCLEAR SSS: 8
LV sys vol: 101 mL
LVDIAVOL: 158 mL (ref 62–150)
Peak HR: 80 {beats}/min
Rest HR: 60 {beats}/min
SDS: 0
TID: 1.13

## 2018-07-25 LAB — BASIC METABOLIC PANEL
BUN / CREAT RATIO: 29 — AB (ref 10–24)
BUN: 27 mg/dL (ref 8–27)
CALCIUM: 10.3 mg/dL — AB (ref 8.6–10.2)
CO2: 19 mmol/L — ABNORMAL LOW (ref 20–29)
Chloride: 101 mmol/L (ref 96–106)
Creatinine, Ser: 0.94 mg/dL (ref 0.76–1.27)
GFR, EST AFRICAN AMERICAN: 93 mL/min/{1.73_m2} (ref >59)
GFR, EST NON AFRICAN AMERICAN: 81 mL/min/{1.73_m2} (ref >59)
Glucose: 129 mg/dL — ABNORMAL HIGH (ref 65–99)
Potassium: 3.6 mmol/L (ref 3.5–5.2)
Sodium: 139 mmol/L (ref 134–144)

## 2018-07-25 LAB — PRO B NATRIURETIC PEPTIDE: NT-Pro BNP: 130 pg/mL (ref 0–376)

## 2018-07-25 MED ORDER — TECHNETIUM TC 99M TETROFOSMIN IV KIT
10.3000 | PACK | Freq: Once | INTRAVENOUS | Status: AC | PRN
  Administered 2018-07-25: 10.3 via INTRAVENOUS
  Filled 2018-07-25: qty 11

## 2018-07-25 MED ORDER — TECHNETIUM TC 99M TETROFOSMIN IV KIT
33.0000 | PACK | Freq: Once | INTRAVENOUS | Status: AC | PRN
  Administered 2018-07-25: 33 via INTRAVENOUS
  Filled 2018-07-25: qty 33

## 2018-07-25 MED ORDER — REGADENOSON 0.4 MG/5ML IV SOLN
0.4000 mg | Freq: Once | INTRAVENOUS | Status: AC
  Administered 2018-07-25: 0.4 mg via INTRAVENOUS

## 2018-08-21 ENCOUNTER — Ambulatory Visit: Payer: Medicare Other | Admitting: Cardiology

## 2018-09-21 ENCOUNTER — Ambulatory Visit: Payer: Medicare Other | Admitting: Cardiology

## 2018-10-26 ENCOUNTER — Encounter: Payer: Self-pay | Admitting: Cardiology

## 2018-10-26 ENCOUNTER — Ambulatory Visit (INDEPENDENT_AMBULATORY_CARE_PROVIDER_SITE_OTHER): Payer: Medicare Other | Admitting: Cardiology

## 2018-10-26 VITALS — BP 124/64 | HR 59 | Ht 68.0 in | Wt 202.4 lb

## 2018-10-26 DIAGNOSIS — I25118 Atherosclerotic heart disease of native coronary artery with other forms of angina pectoris: Secondary | ICD-10-CM | POA: Diagnosis not present

## 2018-10-26 DIAGNOSIS — I420 Dilated cardiomyopathy: Secondary | ICD-10-CM

## 2018-10-26 MED ORDER — VALSARTAN 40 MG PO TABS: 40.0000 mg | ORAL_TABLET | Freq: Two times a day (BID) | ORAL | 3 refills | Status: DC

## 2018-10-26 NOTE — Progress Notes (Signed)
Cardiology Office Note:    Date:  10/26/2018   ID:  Timothy Robertson, DOB [birth year], MRN [id number]  PCP:  Dolan Amen, MD  Cardiologist:  Norman Herrlich, MD    Referring MD: [facility]    ASSESSMENT:    1. Dilated cardiomyopathy (HCC)   2. Coronary artery disease of native artery of native heart with stable angina pectoris (HCC)    PLAN:    In order of problems listed above:  1. Severe it does not appear if he can access Entresto will place him on ARB see back in the office in 3 months.  Labs followed through the [facility] hospital and will continue his current loop diuretic as he still volume overloaded.  I asked him to weigh daily record and bring in his next visit.  Continue other guideline directed medical therapy including carvedilol and when I have access to labs could consider MRA if renal function potassium are normal 2. Stable CAD not having angina does not appear to benefit from further revascularization at this time would not advise an ICD or CRT 3. Stable hyperlipidemia continue with statin 4. Stable diabetes managed through the [facility] hospital   Next appointment: 3 months   Medication Adjustments/Labs and Tests Ordered: Current medicines are reviewed at length with the patient today.  Concerns regarding medicines are outlined above.  No orders of the defined types were placed in this encounter.  No orders of the defined types were placed in this encounter.   Chief Complaint  Patient presents with  . Follow-up    for heart failure    History of Present Illness:    Timothy Robertson is a 73 y.o. male with a hx of CAD with PCI and PAD  last seen 07/14/18.He has a history of myocardial infarction 20 years ago PCI and 2 stents and surprisingly has had no cardiology follow-up or interaction since that time.  He is unaware of whether he had cardiac injury initially.  He recently admitted to the hospital with a TIA echocardiogram done ejection fraction is  severely reduced 30 to 35% with left bundle branch block. Pro BNP 130 on 07/26/18 Echo 07/05/18: Study Conclusions - Left ventricle: The cavity size was normal. Wall thickness was normal. Systolic function was moderately to severely reduced. The estimated ejection fraction was in the range of 30% to 35%. Diffuse hypokinesis. Features are consistent with a pseudonormal left ventricular filling pattern, with concomitant abnormal relaxation and increased filling pressure (grade 2 diastolic dysfunction). - Mitral valve: Mildly calcified annulus. There was moderate regurgitation. - Left atrium: The atrium was moderately dilated.  Compliance with diet, lifestyle and medications: Yes  He is also receiving care at the [facility] was placed on loop diuretic furosemide and has a marked decrease in edema and weight is down somewhere in the range of 30 pounds.  He has no shortness of breath.  He had a myocardial perfusion study done at [facility] March EF 36% fixed defect no ischemia.  He relates in the last week he had an echocardiogram at the [place] EF 41% and does not qualify for Entresto.  We will place him on vasodilator Diovan as these individuals act much more like systolic and combined heart failure and benefit from vasodilators.  He has frequent labs at the [facility] hospital asked him to bring them with him at the next visit and requested a copy of his echocardiogram he has had no angina palpitation or syncope. Past Medical History:  Diagnosis Date  .  Arthritis   . Femoral-femoral bypass graft thrombosis, left (HCC)   . Fibromyalgia   . HOH (hard of hearing)   . Pancreatitis     Past Surgical History:  Procedure Laterality Date  . AORTO-FEMORAL BYPASS GRAFT    . CHOLECYSTECTOMY    . CORONARY ANGIOPLASTY WITH STENT PLACEMENT      Current Medications: Current Meds  Medication Sig  . aspirin EC 325 MG EC tablet Take 1 tablet (325 mg total) by mouth daily.  Marland Kitchen. atorvastatin (LIPITOR) 80 MG  tablet Take 80 mg by mouth daily.  . Calcium Carb-Cholecalciferol (CALCIUM-VITAMIN D) 600-400 MG-UNIT TABS Take 1 tablet by mouth daily.  . carvedilol (COREG) 6.25 MG tablet Take 1 tablet (6.25 mg total) by mouth 2 (two) times daily with a meal.  . cetirizine (ZYRTEC ALLERGY) 10 MG tablet Take 10 mg by mouth daily as needed.   . furosemide (LASIX) 20 MG tablet Take 20 mg by mouth daily.  Marland Kitchen. glipiZIDE (GLUCOTROL) 10 MG tablet Take 10 mg by mouth daily before breakfast.  . metFORMIN (GLUCOPHAGE) 1000 MG tablet Take 1,000 mg by mouth 2 (two) times daily with a meal.  . Omega-3 Fatty Acids (FISH OIL) 1000 MG CAPS Take 1 capsule by mouth daily.  . potassium chloride (K-DUR) 10 MEQ tablet Take 10 mEq by mouth daily.  . promethazine (PHENERGAN) 25 MG tablet Take 25 mg by mouth 2 (two) times daily as needed for nausea or vomiting.     Allergies:   Penicillins and Sulfa antibiotics   Social History   Socioeconomic History  . Marital status: Married    Spouse name: Not on file  . Number of children: Not on file  . Years of education: Not on file  . Highest education level: Not on file  Occupational History  . Not on file  Social Needs  . Financial resource strain: Not on file  . Food insecurity:    Worry: Not on file    Inability: Not on file  . Transportation needs:    Medical: Not on file    Non-medical: Not on file  Tobacco Use  . Smoking status: Former Smoker    Packs/day: 1.00    Years: 40.00    Pack years: 40.00    Types: Cigarettes    Last attempt to quit: 1995    Years since quitting: 25.1  . Smokeless tobacco: Never Used  Substance and Sexual Activity  . Alcohol use: Not Currently  . Drug use: Not Currently  . Sexual activity: Not on file  Lifestyle  . Physical activity:    Days per week: Not on file    Minutes per session: Not on file  . Stress: Not on file  Relationships  . Social connections:    Talks on phone: Not on file    Gets together: Not on file     Attends religious service: Not on file    Active member of club or organization: Not on file    Attends meetings of clubs or organizations: Not on file    Relationship status: Not on file  Other Topics Concern  . Not on file  Social History Narrative  . Not on file     Family History: The patient's family history includes Cancer in his mother; Stroke in his father. ROS:   Please see the history of present illness.    All other systems reviewed and are negative.  EKGs/Labs/Other Studies Reviewed:    The following studies were  reviewed   Recent Labs: 07/03/2018: ALT 23 07/04/2018: Hemoglobin 14.1; Platelets 227 07/25/2018: BUN 27; Creatinine, Ser 0.94; NT-Pro BNP 130; Potassium 3.6; Sodium 139  Recent Lipid Panel    Component Value Date/Time   CHOL 117 07/04/2018 0235   TRIG 147 07/04/2018 0235   HDL 23 (L) 07/04/2018 0235   CHOLHDL 5.1 07/04/2018 0235   VLDL 29 07/04/2018 0235   LDLCALC 65 07/04/2018 0235    Physical Exam:    VS:  BP 124/64 (BP Location: Right Arm, Patient Position: Sitting, Cuff Size: Normal)   Pulse (!) 59   Ht 5\' 8"  (1.727 m)   Wt 202 lb 6.4 oz (91.8 kg)   SpO2 97%   BMI 30.77 kg/m     Wt Readings from Last 3 Encounters:  10/26/18 202 lb 6.4 oz (91.8 kg)  07/25/18 202 lb (91.6 kg)  07/14/18 202 lb (91.6 kg)     GEN:  Well nourished, well developed in no acute distress HEENT: Normal NECK: No JVD; No carotid bruits LYMPHATICS: No lymphadenopathy CARDIAC: RRR, no murmurs, rubs, gallops RESPIRATORY:  Clear to auscultation without rales, wheezing or rhonchi  ABDOMEN: Soft, non-tender, non-distended MUSCULOSKELETAL:  No edema; No deformity  SKIN: Warm and dry NEUROLOGIC:  Alert and oriented x 3 PSYCHIATRIC:  Normal affect    Signed, Norman Herrlich, MD  10/26/2018 11:36 AM     [facility]

## 2018-10-26 NOTE — Patient Instructions (Addendum)
Medication Instructions:  Your physician has recommended you make the following change in your medication: START taking diovan (valsartan) 40 mg two times per day  If you need a refill on your cardiac medications before your next appointment, please call your pharmacy.   Lab work: NONE   Testing/Procedures: NONE  Follow-Up: At [facility], you and your health needs are our priority.  As part of our continuing mission to provide you with exceptional heart care, we have created designated Provider Care Teams.  These Care Teams include your primary Cardiologist (physician) and Advanced Practice Providers (APPs -  Physician Assistants and Nurse Practitioners) who all work together to provide you with the care you need, when you need it. You will need a follow up appointment in 3 months.         Heart Failure  Weigh yourself every morning when you first wake up and record on a calender or note pad, bring this to your office visits. Using a pill tender can help with taking your medications consistently.  Limit your fluid intake to 2 liters daily  Limit your sodium intake to less than 2-3 grams daily. Ask if you need dietary teaching.  If you gain more than 3 pounds (from your dry weight ), double your dose of diuretic for the day.  If you gain more than 5 pounds (from your dry weight), double your dose of lasix and call your heart failure doctor.  Please do not smoke tobacco since it is very bad for your heart.  Please do not drink alcohol since it can worsen your heart failure.Also avoid OTC nonsteroidal drugs, such as advil, aleve and motrin.  Try to exercise for at least 30 minutes every day because this will help your heart be more efficient. You may be eligible for supervised cardiac rehab, ask your physician.   Valsartan tablets What is this medicine? VALSARTAN (val SAR tan) is used to treat high blood pressure. This drug is also used to treat patients with heart failure  and patients who have had a heart attack. This medicine may be used for other purposes; ask your health care provider or pharmacist if you have questions. COMMON BRAND NAME(S): Diovan What should I tell my health care provider before I take this medicine? They need to know if you have any of these conditions: -heart failure -kidney disease -liver disease -an unusual or allergic reaction to valsartan, other medicines, foods, dyes, or preservatives -pregnant or trying to get pregnant -breast-feeding How should I use this medicine? Take this medicine by mouth with a glass of water. Follow the directions on the prescription label. This medicine can be taken with or without food. Take your medicine at regular intervals. Do not take it more often than directed. Talk to your pediatrician regarding the use of this medicine in children. While this drug may be prescribed for children as young as 6 years for selected conditions, precautions do apply. Overdosage: If you think you have taken too much of this medicine contact a poison control center or emergency room at once. NOTE: This medicine is only for you. Do not share this medicine with others. What if I miss a dose? If you miss a dose, take it as soon as you can. If it is almost time for your next dose, take only that dose. Do not take double or extra doses. What may interact with this medicine? -blood pressure medicines -lithium -diuretics, especially triamterene, spironolactone or amiloride -potassium salts or potassium supplements  This list may not describe all possible interactions. Give your health care provider a list of all the medicines, herbs, non-prescription drugs, or dietary supplements you use. Also tell them if you smoke, drink alcohol, or use illegal drugs. Some items may interact with your medicine. What should I watch for while using this medicine? Visit your doctor or health care professional for regular checks on your progress.  Check your blood pressure as directed. Ask your doctor or health care professional what your blood pressure should be and when you should contact him or her. Call your doctor or health care professional if you notice an irregular or fast heart beat. Women should inform their doctor if they wish to become pregnant or think they might be pregnant. There is a potential for serious side effects to an unborn child, particularly in the second or third trimester. Talk to your health care professional or pharmacist for more information. You may get drowsy or dizzy. Do not drive, use machinery, or do anything that needs mental alertness until you know how this drug affects you. Do not stand or sit up quickly, especially if you are an older patient. This reduces the risk of dizzy or fainting spells. Alcohol can make you more drowsy and dizzy. Avoid alcoholic drinks. Avoid salt substitutes unless you are told otherwise by your doctor or health care professional. Do not treat yourself for coughs, colds, or pain while you are taking this medicine without asking your doctor or health care professional for advice. Some ingredients may increase your blood pressure. What side effects may I notice from receiving this medicine? Side effects that you should report to your doctor or health care professional as soon as possible: -confusion, dizziness, light headedness or fainting spells -decreased amount of urine passed -difficulty breathing or swallowing, hoarseness, or tightening of the throat -fast or irregular heart beat, palpitations, or chest pain -skin rash, itching -swelling of your face, lips, tongue, hands, or feet Side effects that usually do not require medical attention (report to your doctor or health care professional if they continue or are bothersome): -cough -decreased sexual function -headache -nausea or stomach pain This list may not describe all possible side effects. Call your doctor for medical  advice about side effects. You may report side effects to FDA at 1-800-FDA-1088. Where should I keep my medicine? Keep out of the reach of children. Store at room temperature between 15 and 30 degrees C (59 and 86 degrees F). Keep your medicine container tightly closed and protect from moisture. Throw away any unused medicine after the expiration date. NOTE: This sheet is a summary. It may not cover all possible information. If you have questions about this medicine, talk to your doctor, pharmacist, or health care provider.  2019 Elsevier/Gold Standard (2012-11-30 12:39:59)

## 2018-11-05 ENCOUNTER — Emergency Department (HOSPITAL_COMMUNITY)
Admission: EM | Admit: 2018-11-05 | Discharge: 2018-11-05 | Disposition: A | Discharge status: Home / Self Care | Payer: Medicare Other | Attending: Emergency Medicine | Admitting: Emergency Medicine

## 2018-11-05 ENCOUNTER — Other Ambulatory Visit: Payer: Self-pay

## 2018-11-05 DIAGNOSIS — Z7984 Long term (current) use of oral hypoglycemic drugs: Secondary | ICD-10-CM | POA: Insufficient documentation

## 2018-11-05 DIAGNOSIS — Z8673 Personal history of transient ischemic attack (TIA), and cerebral infarction without residual deficits: Secondary | ICD-10-CM | POA: Insufficient documentation

## 2018-11-05 DIAGNOSIS — Z79899 Other long term (current) drug therapy: Secondary | ICD-10-CM | POA: Insufficient documentation

## 2018-11-05 DIAGNOSIS — M546 Pain in thoracic spine: Secondary | ICD-10-CM | POA: Diagnosis not present

## 2018-11-05 DIAGNOSIS — Z955 Presence of coronary angioplasty implant and graft: Secondary | ICD-10-CM | POA: Diagnosis not present

## 2018-11-05 DIAGNOSIS — Z87891 Personal history of nicotine dependence: Secondary | ICD-10-CM | POA: Insufficient documentation

## 2018-11-05 DIAGNOSIS — Z7982 Long term (current) use of aspirin: Secondary | ICD-10-CM | POA: Insufficient documentation

## 2018-11-05 DIAGNOSIS — I2510 Atherosclerotic heart disease of native coronary artery without angina pectoris: Secondary | ICD-10-CM | POA: Insufficient documentation

## 2018-11-05 MED ORDER — METHOCARBAMOL 500 MG PO TABS: 500.0000 mg | ORAL_TABLET | Freq: Four times a day (QID) | ORAL | 0 refills | Status: DC | PRN

## 2018-11-05 MED ORDER — NAPROXEN 375 MG PO TABS: 375.0000 mg | ORAL_TABLET | Freq: Two times a day (BID) | ORAL | 0 refills | Status: DC

## 2018-11-05 MED ORDER — METHOCARBAMOL 1000 MG/10ML IJ SOLN
INTRAMUSCULAR | Status: AC
  Filled 2018-11-05: qty 10

## 2018-11-05 MED ORDER — PREDNISONE 20 MG PO TABS: ORAL_TABLET | ORAL | 0 refills | Status: DC

## 2018-11-05 MED ORDER — METHOCARBAMOL 1000 MG/10ML IJ SOLN
1000.0000 mg | Freq: Once | INTRAVENOUS | Status: AC
  Administered 2018-11-05: 1000 mg via INTRAVENOUS
  Filled 2018-11-05: qty 10

## 2018-11-05 MED ORDER — KETOROLAC TROMETHAMINE 30 MG/ML IJ SOLN
15.0000 mg | Freq: Once | INTRAMUSCULAR | Status: AC
  Administered 2018-11-05: 15 mg via INTRAVENOUS
  Filled 2018-11-05: qty 1

## 2018-11-05 MED ORDER — METHYLPREDNISOLONE SODIUM SUCC 125 MG IJ SOLR
60.0000 mg | Freq: Once | INTRAMUSCULAR | Status: AC
  Administered 2018-11-05: 60 mg via INTRAVENOUS
  Filled 2018-11-05: qty 2

## 2018-11-05 NOTE — Discharge Instructions (Addendum)
Apply ice for 20-30 minutes at a time, 3-4 times a day.  You may take acetaminophen as needed for additional pain relief.

## 2018-11-05 NOTE — ED Triage Notes (Signed)
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  PT denies recent fall or injury. Pt reports he has been riding around in car today looking at houses.

## 2018-11-05 NOTE — ED Provider Notes (Signed)
[facility] EMERGENCY DEPARTMENT Provider Note   CSN: [id number] Arrival date & time: 11/05/18  0050    History   Chief Complaint Chief Complaint  Patient presents with  . Back Pain    mid back    HPI Timothy Robertson is a 73 y.o. male.   The history is provided by the patient.  He has history of stroke, coronary artery disease, dilated cardiomyopathy, fibromyalgia and comes in complaining of mid back pain.  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  Pain got significantly worse 2 days ago.  Pain is on the left side without radiation.  He rates it at 10/10.  Is worse with movement.  He denies weakness, numbness, tingling.  He denies bowel or bladder dysfunction.  He has taken acetaminophen without relief.  Past Medical History:  Diagnosis Date  . Arthritis   . Femoral-femoral bypass graft thrombosis, left (HCC)   . Fibromyalgia   . HOH (hard of hearing)   . Pancreatitis     Patient Active Problem List   Diagnosis Date Noted  . CAD (coronary artery disease) 07/14/2018  . Dilated cardiomyopathy (HCC) 07/13/2018  . LBBB (left bundle branch block) 07/13/2018  . 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 TIA (transient ischemic attack) 07/04/2018    Past Surgical History:  Procedure Laterality Date  . AORTO-FEMORAL BYPASS GRAFT    . CHOLECYSTECTOMY    . CORONARY ANGIOPLASTY WITH STENT PLACEMENT          Home Medications    Prior to Admission medications   Medication Sig Start Date End Date Taking? Authorizing Provider  aspirin EC 325 MG EC tablet Take 1 tablet (325 mg total) by mouth daily. 07/06/18   Kathlen Mody, MD  atorvastatin (LIPITOR) 80 MG tablet Take 80 mg by mouth daily.    [provider]  Calcium Carb-Cholecalciferol (CALCIUM-VITAMIN D) 600-400 MG-UNIT TABS Take 1 tablet by mouth daily.    [provider]  carvedilol (COREG)  6.25 MG tablet Take 1 tablet (6.25 mg total) by mouth 2 (two) times daily with a meal. 07/14/18   Munley, Iline Oven, MD  cetirizine (ZYRTEC ALLERGY) 10 MG tablet Take 10 mg by mouth daily as needed.     [provider]  furosemide (LASIX) 20 MG tablet Take 20 mg by mouth daily.    [provider]  glipiZIDE (GLUCOTROL) 10 MG tablet Take 10 mg by mouth daily before breakfast.    [provider]  metFORMIN (GLUCOPHAGE) 1000 MG tablet Take 1,000 mg by mouth 2 (two) times daily with a meal.    [provider]  Omega-3 Fatty Acids (FISH OIL) 1000 MG CAPS Take 1 capsule by mouth daily.    [provider]  potassium chloride (K-DUR) 10 MEQ tablet Take 10 mEq by mouth daily.    [provider]  promethazine (PHENERGAN) 25 MG tablet Take 25 mg by mouth 2 (two) times daily as needed for nausea or vomiting.    [provider]  valsartan (DIOVAN) 40 MG tablet Take 1 tablet (40 mg total) by mouth 2 (two) times daily. 10/26/18   Baldo Daub, MD    Family History Family History  Problem Relation Age of Onset  . Cancer Mother   . Stroke Father     Social History Social History   Tobacco Use  .  Smoking status: Former Smoker    Packs/day: 1.00    Years: 40.00    Pack years: 40.00    Types: Cigarettes    Last attempt to quit: 1995    Years since quitting: 25.1  . Smokeless tobacco: Never Used  Substance Use Topics  . Alcohol use: Not Currently  . Drug use: Not Currently     Allergies   Penicillins and Sulfa antibiotics   Review of Systems Review of Systems  All other systems reviewed and are negative.    Physical Exam Updated Vital Signs BP 125/72 (BP Location: Right Arm)   Pulse 63   Temp 98.1 F (36.7 C) (Oral)   Resp 18   Ht 5\' 8"  (1.727 m)   Wt 90.7 kg   SpO2 95%   BMI 30.41 kg/m   Physical Exam Vitals signs and nursing note reviewed.    73 year old male, resting comfortably and in no acute distress. Vital  signs are normal. Oxygen saturation is 95%, which is normal. Head is normocephalic and atraumatic. PERRLA, EOMI. Oropharynx is clear. Neck is nontender and supple without adenopathy or JVD. Back is moderately tender in the midline in the mid and lower thoracic region..  There is moderate to severe left paraspinal spasm and tenderness in the mid and lower thoracic region.  There is no CVA tenderness.  Straight leg raise is negative. Lungs are clear without rales, wheezes, or rhonchi. Chest is nontender. Heart has regular rate and rhythm without murmur. Abdomen is soft, flat, nontender without masses or hepatosplenomegaly and peristalsis is normoactive. Extremities have no cyanosis or edema, full range of motion is present. Skin is warm and dry without rash. Neurologic: Mental status is normal, cranial nerves are intact, there are no motor or sensory deficits.  ED Treatments / Results   Procedures Procedures  Medications Ordered in ED Medications  ketorolac (TORADOL) 30 MG/ML injection 15 mg (15 mg Intravenous Given 11/05/18 0440)  methocarbamol (ROBAXIN) 1,000 mg in dextrose 5 % 50 mL IVPB (0 mg Intravenous Stopped 11/05/18 0524)  methylPREDNISolone sodium succinate (SOLU-MEDROL) 125 mg/2 mL injection 60 mg (60 mg Intravenous Given 11/05/18 0439)     Initial Impression / Assessment and Plan / ED Course  I have reviewed the triage vital signs and the nursing notes.  Mid back pain which appears to be primarily from musculoskeletal strain.  Old records are reviewed, and he did have a CT of cervical spine in October 2019 which showed spinal stenosis.  At this point, I do not see any indication for imaging of his thoracic spine.  He will be given IV ketorolac, methocarbamol, methylprednisolone.  He feels much better after above-noted treatment.  He is discharged with prescriptions for prednisone, methocarbamol, naproxen.  Follow-up with PCP.  Final Clinical Impressions(s) / ED Diagnoses    Final diagnoses:  Acute left-sided thoracic back pain    ED Discharge Orders         Ordered    predniSONE (DELTASONE) 20 MG tablet     11/05/18 0605    methocarbamol (ROBAXIN) 500 MG tablet  Every 6 hours PRN     11/05/18 0605    naproxen (NAPROSYN) 375 MG tablet  2 times daily     11/05/18 4503           Dione Booze, MD 11/05/18 [phone]

## 2019-01-02 ENCOUNTER — Telehealth: Payer: Self-pay | Admitting: Cardiology

## 2019-01-02 MED ORDER — VALSARTAN 40 MG PO TABS: 40.0000 mg | ORAL_TABLET | Freq: Two times a day (BID) | ORAL | 1 refills | Status: DC

## 2019-01-02 NOTE — Telephone Encounter (Signed)
°*  STAT* If patient is at the pharmacy, call can be transferred to refill team.   1. Which medications need to be refilled? (please list name of each medication and dose if known) valsartan (DIOVAN) 40 MG tablet   2. Which pharmacy/location (including street and city if local pharmacy) is medication to be sent to?  [facility] [address] [place], [place] - [address] [phone] (Phone) [phone] (Fax)     3. Do they need a 30 day or 90 day supply? 90 day

## 2019-01-19 ENCOUNTER — Telehealth: Payer: Self-pay | Admitting: Cardiology

## 2019-01-19 NOTE — Telephone Encounter (Signed)
Virtual Visit Pre-Appointment Phone Call  "(Name), I am calling you today to discuss your upcoming appointment. We are currently trying to limit exposure to the virus that causes COVID-19 by seeing patients at home rather than in the office."  1. "What is the BEST phone number to call the day of the visit?" - include this in appointment notes  2. Do you have or have access to (through a family member/friend) a smartphone with video capability that we can use for your visit?" a. If yes - list this number in appt notes as cell (if different from BEST phone #) and list the appointment type as a VIDEO visit in appointment notes b. If no - list the appointment type as a PHONE visit in appointment notes  3. Confirm consent - "In the setting of the current Covid19 crisis, you are scheduled for a (phone or video) visit with your provider on (date) at (time).  Just as we do with many in-office visits, in order for you to participate in this visit, we must obtain consent.  If you'd like, I can send this to your mychart (if signed up) or email for you to review.  Otherwise, I can obtain your verbal consent now.  All virtual visits are billed to your insurance company just like a normal visit would be.  By agreeing to a virtual visit, we'd like you to understand that the technology does not allow for your provider to perform an examination, and thus may limit your provider's ability to fully assess your condition. If your provider identifies any concerns that need to be evaluated in person, we will make arrangements to do so.  Finally, though the technology is pretty good, we cannot assure that it will always work on either your or our end, and in the setting of a video visit, we may have to convert it to a phone-only visit.  In either situation, we cannot ensure that we have a secure connection.  Are you willing to proceed?" STAFF: Did the patient verbally acknowledge consent to telehealth visit? Document  YES/NO here: Yes  4. Advise patient to be prepared - "Two hours prior to your appointment, go ahead and check your blood pressure, pulse, oxygen saturation, and your weight (if you have the equipment to check those) and write them all down. When your visit starts, your provider will ask you for this information. If you have an Apple Watch or Kardia device, please plan to have heart rate information ready on the day of your appointment. Please have a pen and paper handy nearby the day of the visit as well."  5. Give patient instructions for MyChart download to smartphone OR Doximity/Doxy.me as below if video visit (depending on what platform provider is using)  6. Inform patient they will receive a phone call 15 minutes prior to their appointment time (may be from unknown caller ID) so they should be prepared to answer    TELEPHONE CALL NOTE  Timothy Robertson has been deemed a candidate for a follow-up tele-health visit to limit community exposure during the Covid-19 pandemic. I spoke with the patient via phone to ensure availability of phone/video source, confirm preferred email & phone number, and discuss instructions and expectations.  I reminded Timothy Robertson to be prepared with any vital sign and/or heart rhythm information that could potentially be obtained via home monitoring, at the time of his visit. I reminded Timothy Robertson to expect a phone call prior to his visit.  [name] 01/19/2019 2:06 PM   INSTRUCTIONS FOR DOWNLOADING THE MYCHART APP TO SMARTPHONE  - The patient must first make sure to have activated MyChart and know their login information - If Apple, go to [employer] Store and type in MyChart in the search bar and download the app. If Android, ask patient to go to [employer] Play Store and type in [place] in the search bar and download the app. The app is free but as with any other app downloads, their phone may require them to verify saved payment information or  Apple/Android password.  - The patient will need to then log into the app with their MyChart username and password, and select [facility] as their healthcare provider to link the account. When it is time for your visit, go to the MyChart app, find appointments, and click Begin Video Visit. Be sure to Select Allow for your device to access the Microphone and Camera for your visit. You will then be connected, and your provider will be with you shortly.  **If they have any issues connecting, or need assistance please contact MyChart service desk [phone])**  **If using a computer, in order to ensure the best quality for their visit they will need to use either of the following Internet Browsers: [employer] Edge, or Google Chrome**  IF USING DOXIMITY or DOXY.ME - The patient will receive a link just prior to their visit by text.     FULL LENGTH CONSENT FOR TELE-HEALTH VISIT   I hereby voluntarily request, consent and authorize [facility] and its employed or contracted physicians, physician assistants, nurse practitioners or other licensed health care professionals (the Practitioner), to provide me with telemedicine health care services (the Services") as deemed necessary by the treating Practitioner. I acknowledge and consent to receive the Services by the Practitioner via telemedicine. I understand that the telemedicine visit will involve communicating with the Practitioner through live audiovisual communication technology and the disclosure of certain medical information by electronic transmission. I acknowledge that I have been given the opportunity to request an in-person assessment or other available alternative prior to the telemedicine visit and am voluntarily participating in the telemedicine visit.  I understand that I have the right to withhold or withdraw my consent to the use of telemedicine in the course of my care at any time, without affecting my right to future care  or treatment, and that the Practitioner or I may terminate the telemedicine visit at any time. I understand that I have the right to inspect all information obtained and/or recorded in the course of the telemedicine visit and may receive copies of available information for a reasonable fee.  I understand that some of the potential risks of receiving the Services via telemedicine include:   Delay or interruption in medical evaluation due to technological equipment failure or disruption;  Information transmitted may not be sufficient (e.g. poor resolution of images) to allow for appropriate medical decision making by the Practitioner; and/or   In rare instances, security protocols could fail, causing a breach of personal health information.  Furthermore, I acknowledge that it is my responsibility to provide information about my medical history, conditions and care that is complete and accurate to the best of my ability. I acknowledge that Practitioner's advice, recommendations, and/or decision may be based on factors not within their control, such as incomplete or inaccurate data provided by me or distortions of diagnostic images or specimens that may result from electronic transmissions. I understand that the  practice of medicine is not an Chief Strategy Officer and that Practitioner makes no warranties or guarantees regarding treatment outcomes. I acknowledge that I will receive a copy of this consent concurrently upon execution via email to the email address I last provided but may also request a printed copy by calling the office of [facility].    I understand that my insurance will be billed for this visit.   I have read or had this consent read to me.  I understand the contents of this consent, which adequately explains the benefits and risks of the Services being provided via telemedicine.   I have been provided ample opportunity to ask questions regarding this consent and the Services and have had  my questions answered to my satisfaction.  I give my informed consent for the services to be provided through the use of telemedicine in my medical care  By participating in this telemedicine visit I agree to the above.

## 2019-01-29 ENCOUNTER — Other Ambulatory Visit: Payer: Self-pay

## 2019-01-29 ENCOUNTER — Encounter: Payer: Self-pay | Admitting: Cardiology

## 2019-01-29 ENCOUNTER — Telehealth (INDEPENDENT_AMBULATORY_CARE_PROVIDER_SITE_OTHER): Payer: Medicare Other | Admitting: Cardiology

## 2019-01-29 VITALS — Ht 68.0 in | Wt 197.0 lb

## 2019-01-29 DIAGNOSIS — I420 Dilated cardiomyopathy: Secondary | ICD-10-CM

## 2019-01-29 DIAGNOSIS — I447 Left bundle-branch block, unspecified: Secondary | ICD-10-CM

## 2019-01-29 DIAGNOSIS — I25118 Atherosclerotic heart disease of native coronary artery with other forms of angina pectoris: Secondary | ICD-10-CM

## 2019-01-29 DIAGNOSIS — I5042 Chronic combined systolic (congestive) and diastolic (congestive) heart failure: Secondary | ICD-10-CM

## 2019-01-29 NOTE — Patient Instructions (Signed)
Medication Instructions:  Your physician recommends that you continue on your current medications as directed. Please refer to the Current Medication list given to you today.  If you need a refill on your cardiac medications before your next appointment, please call your pharmacy.   Lab work: NOne If you have labs (blood work) drawn today and your tests are completely normal, you will receive your results only by: Marland Kitchen MyChart Message (if you have MyChart) OR . A paper copy in the mail If you have any lab test that is abnormal or we need to change your treatment, we will call you to review the results.  Testing/Procedures: None  Follow-Up: At [facility], you and your health needs are our priority.  As part of our continuing mission to provide you with exceptional heart care, we have created designated Provider Care Teams.  These Care Teams include your primary Cardiologist (physician) and Advanced Practice Providers (APPs -  Physician Assistants and Nurse Practitioners) who all work together to provide you with the care you need, when you need it. Follow up as needed

## 2019-01-29 NOTE — Progress Notes (Signed)
Virtual Visit via Telephone Note   This visit type was conducted due to national recommendations for restrictions regarding the COVID-19 Pandemic (e.g. social distancing) in an effort to limit this patient's exposure and mitigate transmission in our community.  Due to his co-morbid illnesses, this patient is at least at moderate risk for complications without adequate follow up.  This format is felt to be most appropriate for this patient at this time.  The patient did not have access to video technology/had technical difficulties with video requiring transitioning to audio format only (telephone).  All issues noted in this document were discussed and addressed.  No physical exam could be performed with this format.  Please refer to the patient's chart for his  consent to [facility].   Date:  01/29/2019   ID:  Timothy SitesPatrick Robertson, DOB 05/15/1946, MRN [id number]  Patient Location: Home Provider Location: Office  PCP:  Dolan AmenNorwood, Dorothy, MD  Cardiologist:  No primary care provider on file.  Electrophysiologist:  None   Evaluation Performed:  Follow-Up Visit  Chief Complaint:  73 yo male presents for 3 months follow up of dilated cardiomyopathy after being started on valsartan.   History of Present Illness:    Timothy Sitesatrick Taves is a 73 y.o. 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  He is unaware of whether he had cardiac injury initially.  He recently admitted to the hospital with a TIA echocardiogram done ejection fraction is severely reduced 30 to 35% with left bundle branch block. Pro BNP 130 on 07/26/18. He was last seen 10/26/18.  [facility] an [facility] fraction [facility]% and [facility] and was placed on [facility]sartan.  He has been doing  well. Endorses some DOE with activity. Tries to exercise by walking to the mailbox. No SOB at rest. No chest pain. States he watches his salt intake. [facility] and states this has helped his LE edema.  At present his heart failure is compensated New York Heart Association class I is on good guideline directed therapy.  I told an ACE inhibitor is is good his valsartan my concern as well as confusion of medications and care in one-on-one to do his withdrawal and he will [facility].   The patient does not have symptoms concerning for COVID-19 infection (fever, chills, cough, or new shortness of breath).    Past Medical History:  Diagnosis Date  . Arthritis   . Femoral-femoral bypass graft thrombosis, left (HCC)   . Fibromyalgia   . HOH (hard of hearing)   . Pancreatitis    Past Surgical History:  Procedure Laterality Date  . AORTO-FEMORAL BYPASS GRAFT    . CHOLECYSTECTOMY    . CORONARY ANGIOPLASTY WITH STENT PLACEMENT       No outpatient medications have been marked as taking for the 01/29/19 encounter (Appointment) with Baldo DaubMunley, Jennye Runquist J, MD.     Allergies:   Penicillins and Sulfa antibiotics   Social History   Tobacco Use  . Smoking status: Former Smoker    Packs/day: 1.00    Years: 40.00    Pack years: 40.00    Types: Cigarettes    Last attempt to quit: 1995  Years since quitting: 25.3  . Smokeless tobacco: Never Used  Substance Use Topics  . Alcohol use: Not Currently  . Drug use: Not Currently     Family Hx: The patient's family history includes Cancer in his mother; Stroke in his father.  ROS:   Please see the history of present illness.     All other systems reviewed and are negative.   Prior CV studies:      Labs/Other Tests and Data Reviewed:    EKG:  No ECG reviewed.  Recent Labs: 07/03/2018: ALT 23 07/04/2018: Hemoglobin 14.1; Platelets 227 07/25/2018: BUN 27;  Creatinine, Ser 0.94; NT-Pro BNP 130; Potassium 3.6; Sodium 139   Recent Lipid Panel Lab Results  Component Value Date/Time   CHOL 117 07/04/2018 02:35 AM   TRIG 147 07/04/2018 02:35 AM   HDL 23 (L) 07/04/2018 02:35 AM   CHOLHDL 5.1 07/04/2018 02:35 AM   LDLCALC 65 07/04/2018 02:35 AM    Wt Readings from Last 3 Encounters:  11/05/18 200 lb (90.7 kg)  10/26/18 202 lb 6.4 oz (91.8 kg)  07/25/18 202 lb (91.6 kg)     Objective:    Vital Signs:  There were no vitals taken for this visit.   VITAL SIGNS:  reviewed  ASSESSMENT & PLAN:    1. 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 Echo at the [facility] showed EF 41% which made him unable to qualify for Entresto. Continue current plan - on GDMT. Will defer further management to the [place].  2. CAD - Stable. No angina.   COVID-19 Education: The signs and symptoms of COVID-19 were discussed with the patient and how to seek care for testing (follow up with PCP or arrange E-visit).  The importance of social distancing was discussed today.  Time:   Today, I have spent 15 minutes with the patient with telehealth technology discussing the above problems.     Medication Adjustments/Labs and Tests Ordered: Current medicines are reviewed at length with the patient today.  Concerns regarding medicines are outlined above.   Tests Ordered: No orders of the defined types were placed in this encounter.   Medication Changes: No orders of the defined types were placed in this encounter.   Disposition:  Follow up prn. At present getting cardi[facility] care through [facility] who is directing his medications.   Signed, Norman Herrlich, MD  01/29/2019 12:13 PM    [facility]

## 2019-01-30 ENCOUNTER — Telehealth: Payer: Medicare Other | Admitting: Cardiology

## 2019-09-20 IMAGING — MR MR HEAD W/O CM
10 of 11 series · 42 of 48 positions shown · non-contrast
Comparison: Head CT from yesterday

CLINICAL DATA: Speech difficulty and headache

EXAM:
MRI HEAD WITHOUT CONTRAST
TECHNIQUE: Multiplanar, multiecho pulse sequences of the brain and surrounding
structures were obtained without intravenous contrast.

[Series 5: ax dwi_tracew · axial · 3.0mm · 1.50mm/px · z∈[-37,+92]mm · 9 of 88 slices shown]
[im 1/88]
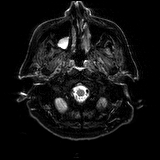
[im 11/88]
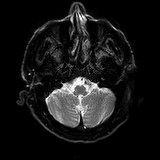
[im 22/88]
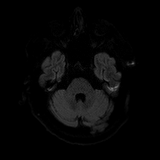
[im 33/88]
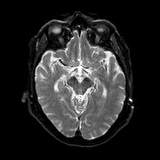
[im 44/88]
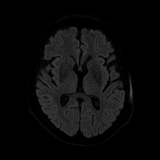
[im 55/88]
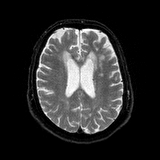
[im 66/88]
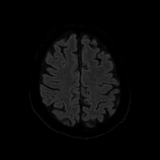
[im 77/88]
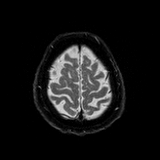
[im 88/88]
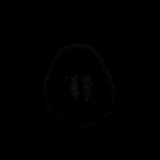

[Series 6: ax dwi_adc · axial · 3.0mm · 1.50mm/px · z∈[-37,+92]mm · 4 of 44 slices shown]
[im 1/44]
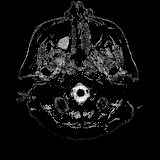
[im 15/44]
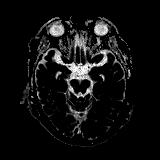
[im 29/44]
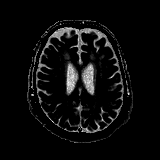
[im 44/44]
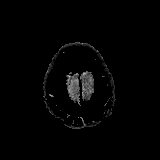

[Series 7: cor dwi_tracew · coronal · 5.0mm · 1.44mm/px · 6 of 64 slices shown]
[im 1/64]
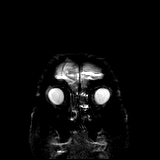
[im 13/64]
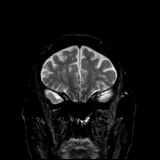
[im 26/64]
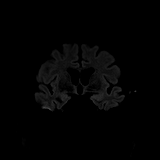
[im 38/64]
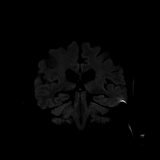
[im 51/64]
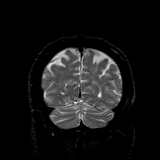
[im 64/64]
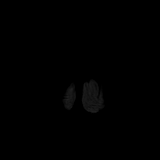

[Series 8: cor dwi_adc · coronal · 5.0mm · 1.44mm/px · 3 of 32 slices shown]
[im 1/32]
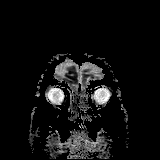
[im 16/32]
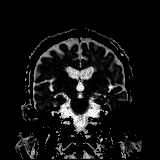
[im 32/32]
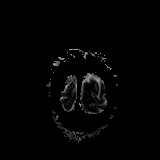

[Series 9: T1 · sagittal · 5.0mm · 0.75mm/px · 2 of 23 slices shown]
[im 1/23]
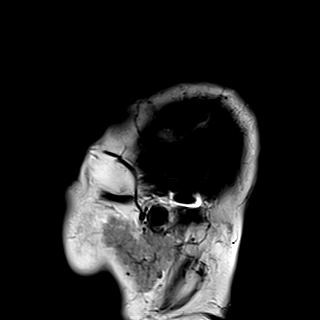
[im 23/23]
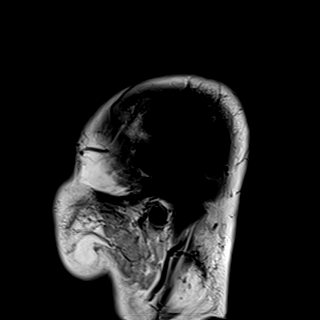

[Series 10: T2 · axial · 5.0mm · 0.72mm/px · z∈[-46,+98]mm · 2 of 25 slices shown (1 of 2)]
[im 1/25]
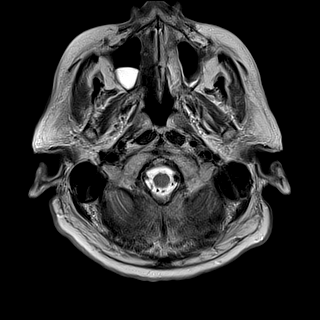
[im 25/25]
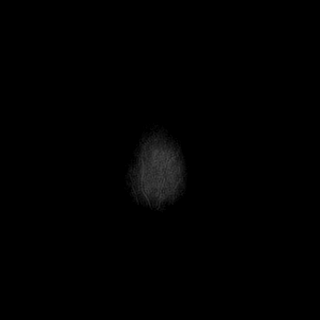

[Series 11: FLAIR · axial · 5.0mm · 0.45mm/px · z∈[-46,+98]mm · 2 of 25 slices shown]
[im 1/25]
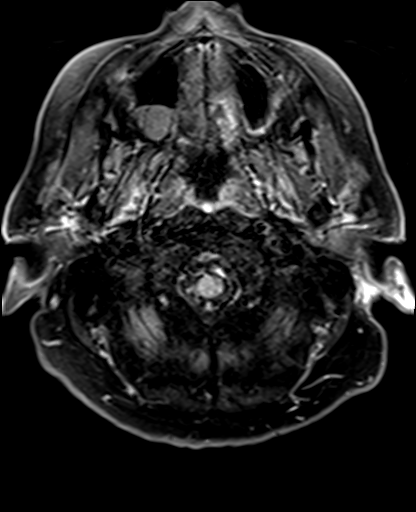
[im 25/25]
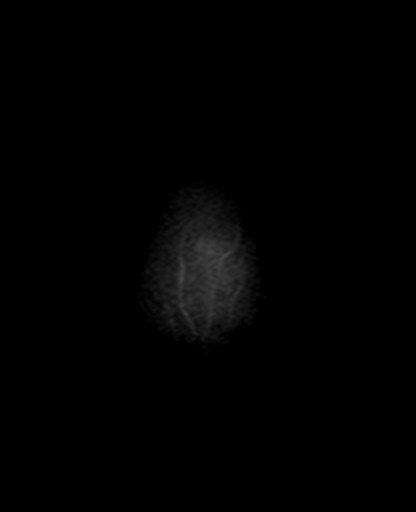

[Series 12: swi_images · axial · 3.0mm · 0.90mm/px · z∈[-65,+112]mm · 6 of 60 slices shown]
[im 1/60]
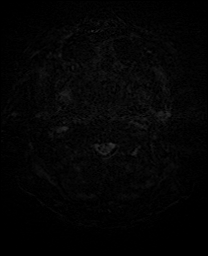
[im 12/60]
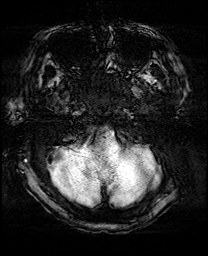
[im 24/60]
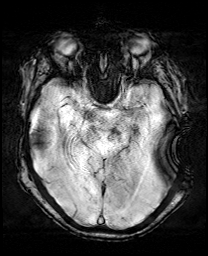
[im 36/60]
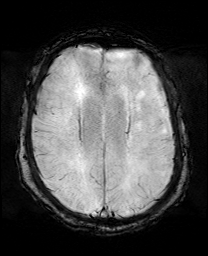
[im 48/60]
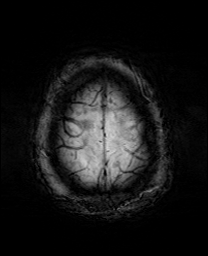
[im 60/60]
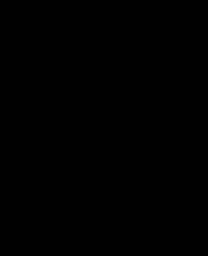

[Series 13: mip_images(sw) · axial · 24.0mm · 0.90mm/px · z∈[-54,+102]mm · 5 of 53 slices shown]
[im 1/53]
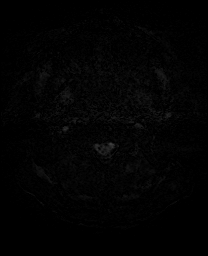
[im 14/53]
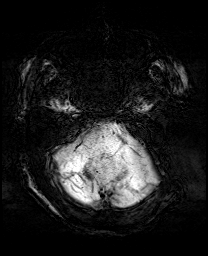
[im 27/53]
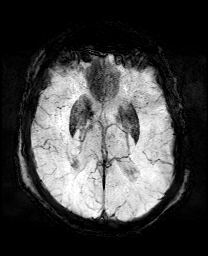
[im 40/53]
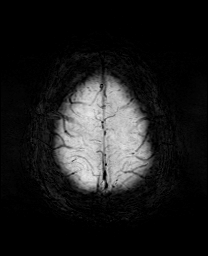
[im 53/53]
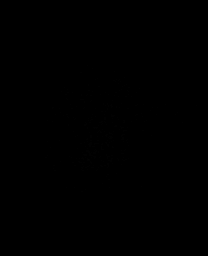

[Series 15: T2 · coronal · 5.0mm · 0.34mm/px · 3 of 29 slices shown (2 of 2)]
[im 1/29]
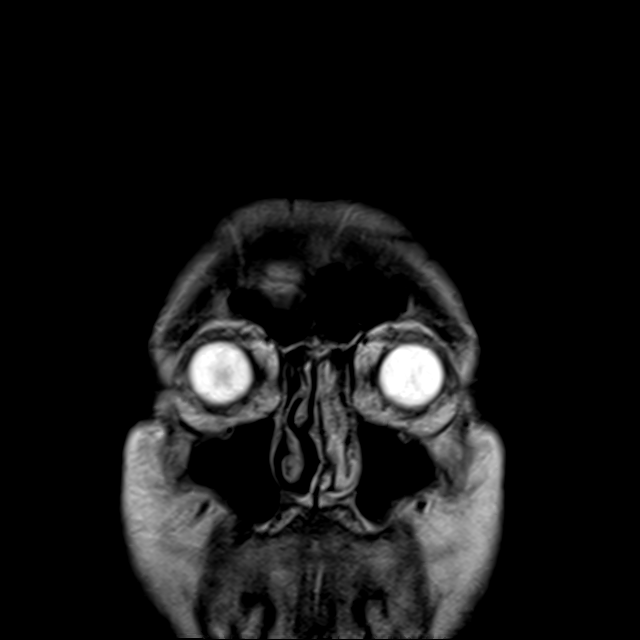
[im 15/29]
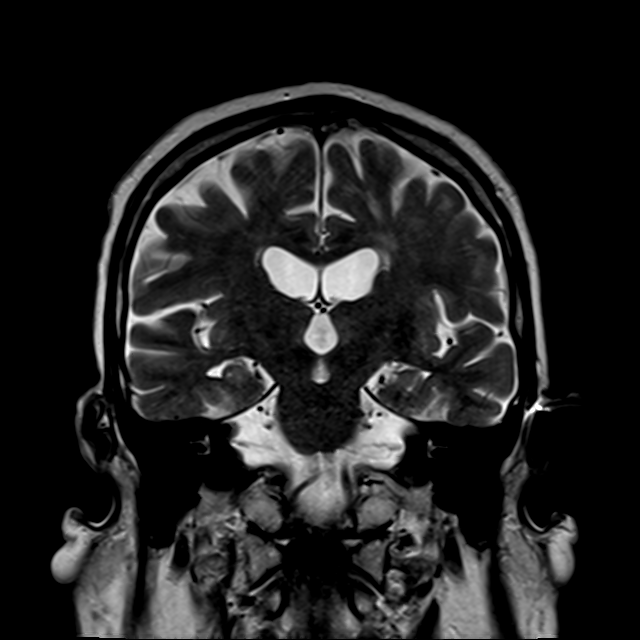
[im 29/29]
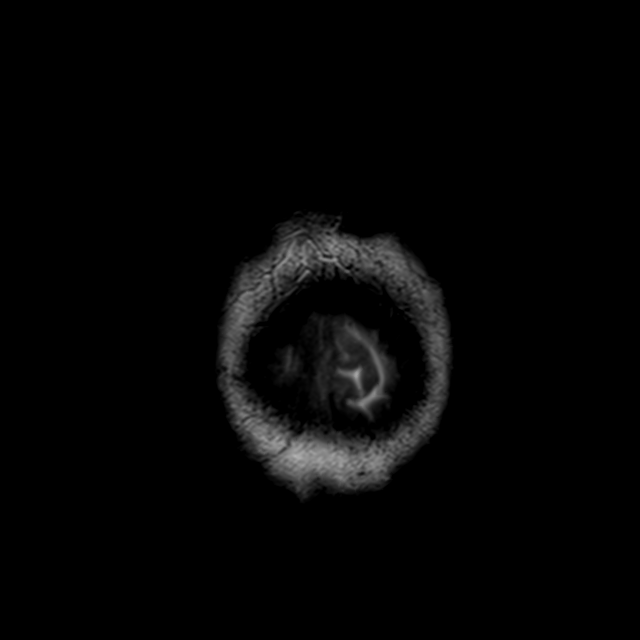

[42 of 48 positions shown; findings below may reference images not displayed]

FINDINGS: Brain: No acute infarction, hemorrhage, hydrocephalus, extra-axial
collection or mass lesion. Mild FLAIR hyperintensity in the cerebral
white matter attributed to chronic small vessel ischemia. There has
been a discrete remote infarct in the bifrontal white matter, with
band of gliosis crossing the corpus callosum.

Vascular: Major flow voids are preserved

Skull and upper cervical spine: No evidence of marrow lesion

Sinuses/Orbits: Mild mucosal thickening with retention cysts in the
right maxillary sinus.
IMPRESSION: 1. No acute or reversible finding.
2. Chronic small vessel ischemia in the cerebral white matter,
overall mild.

## 2019-10-26 ENCOUNTER — Other Ambulatory Visit: Payer: Self-pay

## 2019-10-26 ENCOUNTER — Other Ambulatory Visit: Payer: Medicare Other

## 2019-10-26 ENCOUNTER — Ambulatory Visit (INDEPENDENT_AMBULATORY_CARE_PROVIDER_SITE_OTHER): Payer: Medicare Other

## 2019-10-26 ENCOUNTER — Ambulatory Visit: Payer: Medicare Other | Attending: Internal Medicine

## 2019-10-26 ENCOUNTER — Ambulatory Visit
Admission: EM | Admit: 2019-10-26 | Discharge: 2019-10-26 | Disposition: A | Discharge status: Home / Self Care | Payer: Medicare Other | Attending: Emergency Medicine | Admitting: Emergency Medicine

## 2019-10-26 DIAGNOSIS — M25541 Pain in joints of right hand: Secondary | ICD-10-CM

## 2019-10-26 DIAGNOSIS — Z20822 Contact with and (suspected) exposure to covid-19: Secondary | ICD-10-CM

## 2019-10-26 DIAGNOSIS — S62636A Displaced fracture of distal phalanx of right little finger, initial encounter for closed fracture: Secondary | ICD-10-CM | POA: Diagnosis not present

## 2019-10-26 MED ORDER — IBUPROFEN 600 MG PO TABS: 600.0000 mg | ORAL_TABLET | Freq: Four times a day (QID) | ORAL | 0 refills | Status: DC | PRN

## 2019-10-26 NOTE — Discharge Instructions (Addendum)
Advised patient to take ibuprofen 600 mg as prescribed for pain To follow-up with primary care to get a referral to orthopedic To return for worsening of symptoms

## 2019-10-26 NOTE — ED Provider Notes (Addendum)
[facility]    CSN: [id number] Arrival date & time: 10/26/19  1514      History   Chief Complaint Chief Complaint  Patient presents with  . Finger Injury    HPI Timothy Robertson is a 74 y.o. male.   Who presents to the urgent care with a complaint of swelling right little finger for the past 1 week.  Report a precipitating event, or specific injury. Patient stated he smashed his finger while closing the oven.  Patient localizes pain to to his right middle finger.  Describes it as constant and achy in character.  Report he has not tried any OTC medication.  Symptoms are made worse with range of motion.  Denies similar symptoms in the past.  Denies fever, chills, appetite change, weight change, chest pain, nausea, vomiting, changes in bowel or bladder habits.   The history is provided by the patient. No language interpreter was used.    Past Medical History:  Diagnosis Date  . Arthritis   . Femoral-femoral bypass graft thrombosis, left (HCC)   . Fibromyalgia   . HOH (hard of hearing)   . Pancreatitis     Patient Active Problem List   Diagnosis Date Noted  . CAD (coronary artery disease) 07/14/2018  . Dilated cardiomyopathy (HCC) 07/13/2018  . LBBB (left bundle branch block) 07/13/2018  . 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 TIA (transient ischemic attack) 07/04/2018    Past Surgical History:  Procedure Laterality Date  . AORTO-FEMORAL BYPASS GRAFT    . CHOLECYSTECTOMY    . CORONARY ANGIOPLASTY WITH STENT PLACEMENT         Home Medications    Prior to Admission medications   Medication Sig Start Date End Date Taking? Authorizing Provider  aspirin EC 325 MG EC tablet Take 1 tablet (325 mg total) by mouth daily. 07/06/18   Kathlen Mody, MD  atorvastatin (LIPITOR) 80 MG tablet Take 80 mg by mouth daily.    [provider]  Calcium Carb-Cholecalciferol (CALCIUM-VITAMIN D) 600-400 MG-UNIT TABS Take 1 tablet by mouth daily.    [provider]  carvedilol (COREG) 6.25 MG tablet Take 1 tablet (6.25 mg total) by mouth 2 (two) times daily with a meal. 07/14/18   Munley, Iline Oven, MD  cetirizine (ZYRTEC ALLERGY) 10 MG tablet Take 10 mg by mouth daily as needed.     [provider]  furosemide (LASIX) 20 MG tablet Take 20 mg by mouth daily.    [provider]  glipiZIDE (GLUCOTROL) 10 MG tablet Take 10 mg by mouth daily before breakfast.    [provider]  ibuprofen (ADVIL) 600 MG tablet Take 1 tablet (600 mg total) by mouth every 6 (six) hours as needed. Take with food 10/26/19   Meshelle Holness, Zachery Dakins, FNP  metFORMIN (GLUCOPHAGE) 1000 MG tablet Take 1,000 mg by mouth 2 (two) times daily with a meal.    [provider]  methocarbamol (ROBAXIN) 500 MG tablet Take 1 tablet (500 mg total) by mouth every 6 (six) hours as needed for muscle spasms. 11/05/18   Dione Booze, MD  naproxen (NAPROSYN) 375 MG tablet Take 1 tablet (375 mg total) by mouth 2 (two) times daily. 11/05/18   Dione Booze, MD  Omega-3 Fatty Acids (FISH OIL) 1000 MG CAPS Take 1 capsule by mouth daily.    [provider]  potassium chloride (K-DUR) 10 MEQ tablet Take 10 mEq by mouth daily.    [provider]  promethazine (PHENERGAN)  25 MG tablet Take 25 mg by mouth 2 (two) times daily as needed for nausea or vomiting.    [provider]  valsartan (DIOVAN) 40 MG tablet Take 1 tablet (40 mg total) by mouth 2 (two) times daily. 01/02/19   Richardo Priest, MD    Family History Family History  Problem Relation Age of Onset  . Cancer Mother   . Stroke Father     Social History Social History   Tobacco Use  . Smoking status: Former Smoker    Packs/day: 1.00    Years: 40.00    Pack years: 40.00    Types: Cigarettes    Quit date: 1995    Years since quitting: 26.1  . Smokeless tobacco: Never Used  Substance Use Topics  . Alcohol use: Not Currently  . Drug use: Not Currently     Allergies     Penicillins and Sulfa antibiotics   Review of Systems Review of Systems  Constitutional: Negative.   Respiratory: Negative.   Cardiovascular: Negative.   Musculoskeletal: Positive for joint swelling.  All other systems reviewed and are negative.    Physical Exam Triage Vital Signs ED Triage Vitals  Enc Vitals Group     BP 10/26/19 1527 134/76     Pulse Rate 10/26/19 1527 61     Resp 10/26/19 1527 18     Temp 10/26/19 1527 97.6 F (36.4 C)     Temp Source 10/26/19 1527 Oral     SpO2 10/26/19 1527 96 %     Weight --      Height --      Head Circumference --      Peak Flow --      Pain Score 10/26/19 1524 8     Pain Loc --      Pain Edu? --      Excl. in [place]? --    No data found.  Updated Vital Signs BP 134/76   Pulse 61   Temp 97.6 F (36.4 C) (Oral)   Resp 18   SpO2 96%   Visual Acuity Right Eye Distance:   Left Eye Distance:   Bilateral Distance:    Right Eye Near:   Left Eye Near:    Bilateral Near:     Physical Exam Vitals and nursing note reviewed.  Constitutional:      General: He is not in acute distress.    Appearance: Normal appearance. He is not ill-appearing, toxic-appearing or diaphoretic.  Cardiovascular:     Rate and Rhythm: Normal rate and regular rhythm.     Pulses: Normal pulses.     Heart sounds: Normal heart sounds. No murmur. No gallop.   Pulmonary:     Effort: Pulmonary effort is normal. No respiratory distress.     Breath sounds: Normal breath sounds. No stridor. No wheezing, rhonchi or rales.  Chest:     Chest wall: No tenderness.  Musculoskeletal:        General: Swelling and tenderness present. No deformity. Normal range of motion.     Right hand: Swelling and tenderness present. No deformity or lacerations. Normal strength.     Left hand: Normal.  Neurological:     Mental Status: He is alert.      UC Treatments / Results  Labs (all labs ordered are listed, but only abnormal results are displayed) Labs Reviewed -  No data to display  EKG   Radiology DG Hand Complete Right  Result Date: 10/26/2019 CLINICAL DATA:  Fifth digit pain following closing finger in oven door 1 week ago EXAM: RIGHT HAND - COMPLETE 3+ VIEW COMPARISON:  None. FINDINGS: There is an avulsion fracture from the base of the fifth distal phalanx dorsally with mild displacement identified. No other focal abnormality is seen. IMPRESSION: Fracture at the base of the fifth distal phalanx. Electronically Signed   By: Alcide Clever M.D.   On: 10/26/2019 15:46    Procedures Procedures (including critical care time)  Medications Ordered in UC Medications - No data to display  Initial Impression / Assessment and Plan / UC Course  I have reviewed the triage vital signs and the nursing notes.  Pertinent labs & imaging results that were available during my care of the patient were reviewed by me and considered in my medical decision making (see chart for details).     Closed fracture of fifth distal phalanx Right hand x-ray show fracture of the base of the fifth distal phalanx.  I have reviewed the x-ray myself and the radiologist interpretation.  I am in agreement with the radiologist interpretation. Ibuprofen 600 mg was prescribed Finger splint was applied in office Advised patient to follow-up with orthopedic  Final Clinical Impressions(s) / UC Diagnoses   Final diagnoses:  Closed displaced fracture of distal phalanx of right little finger, initial encounter     Discharge Instructions     Advised patient to take ibuprofen 600 mg as prescribed for pain To follow-up with primary care to get a referral to orthopedic To return for worsening of symptoms    ED Prescriptions    Medication Sig Dispense Auth. Provider   ibuprofen (ADVIL) 600 MG tablet Take 1 tablet (600 mg total) by mouth every 6 (six) hours as needed. Take with food 30 tablet Deerica Waszak, Zachery Dakins, FNP     PDMP not reviewed this encounter.   Durward Parcel,  FNP 10/26/19 1606    Durward Parcel, FNP 10/26/19 1607

## 2019-10-26 NOTE — ED Triage Notes (Signed)
Pt injured right pinky finger last week when he smashed it in oven while cooking. Bruising and deformity noted , states pain has worsened

## 2019-10-27 LAB — NOVEL CORONAVIRUS, NAA: SARS-CoV-2, NAA: NOT DETECTED

## 2020-01-15 ENCOUNTER — Emergency Department (HOSPITAL_COMMUNITY)
Admission: EM | Admit: 2020-01-15 | Discharge: 2020-01-15 | Disposition: A | Discharge status: Home / Self Care | Payer: No Typology Code available for payment source | Attending: Emergency Medicine | Admitting: Emergency Medicine

## 2020-01-15 ENCOUNTER — Other Ambulatory Visit: Payer: Self-pay

## 2020-01-15 ENCOUNTER — Other Ambulatory Visit: Payer: Self-pay | Admitting: Physician Assistant

## 2020-01-15 ENCOUNTER — Encounter (HOSPITAL_COMMUNITY): Payer: Self-pay | Admitting: Emergency Medicine

## 2020-01-15 DIAGNOSIS — I440 Atrioventricular block, first degree: Secondary | ICD-10-CM | POA: Insufficient documentation

## 2020-01-15 DIAGNOSIS — Z79899 Other long term (current) drug therapy: Secondary | ICD-10-CM | POA: Insufficient documentation

## 2020-01-15 DIAGNOSIS — I2510 Atherosclerotic heart disease of native coronary artery without angina pectoris: Secondary | ICD-10-CM | POA: Insufficient documentation

## 2020-01-15 DIAGNOSIS — Z7982 Long term (current) use of aspirin: Secondary | ICD-10-CM | POA: Insufficient documentation

## 2020-01-15 DIAGNOSIS — I10 Essential (primary) hypertension: Secondary | ICD-10-CM

## 2020-01-15 DIAGNOSIS — R9431 Abnormal electrocardiogram [ECG] [EKG]: Secondary | ICD-10-CM | POA: Diagnosis present

## 2020-01-15 DIAGNOSIS — Z8673 Personal history of transient ischemic attack (TIA), and cerebral infarction without residual deficits: Secondary | ICD-10-CM | POA: Diagnosis not present

## 2020-01-15 DIAGNOSIS — Z87891 Personal history of nicotine dependence: Secondary | ICD-10-CM | POA: Diagnosis not present

## 2020-01-15 DIAGNOSIS — R001 Bradycardia, unspecified: Secondary | ICD-10-CM

## 2020-01-15 DIAGNOSIS — I447 Left bundle-branch block, unspecified: Secondary | ICD-10-CM | POA: Diagnosis not present

## 2020-01-15 DIAGNOSIS — I25118 Atherosclerotic heart disease of native coronary artery with other forms of angina pectoris: Secondary | ICD-10-CM

## 2020-01-15 LAB — CBC
HCT: 48.3 % (ref 39.0–52.0)
Hemoglobin: 15.3 g/dL (ref 13.0–17.0)
MCH: 30.3 pg (ref 26.0–34.0)
MCHC: 31.7 g/dL (ref 30.0–36.0)
MCV: 95.6 fL (ref 80.0–100.0)
Platelets: 216 10*3/uL (ref 150–400)
RBC: 5.05 MIL/uL (ref 4.22–5.81)
RDW: 14.5 % (ref 11.5–15.5)
WBC: 9.4 10*3/uL (ref 4.0–10.5)
nRBC: 0 % (ref 0.0–0.2)

## 2020-01-15 LAB — CBG MONITORING, ED: Glucose-Capillary: 74 mg/dL (ref 70–99)

## 2020-01-15 LAB — BASIC METABOLIC PANEL
Anion gap: 12 (ref 5–15)
BUN: 23 mg/dL (ref 8–23)
CO2: 17 mmol/L — ABNORMAL LOW (ref 22–32)
Calcium: 9.5 mg/dL (ref 8.9–10.3)
Chloride: 111 mmol/L (ref 98–111)
Creatinine, Ser: 1.04 mg/dL (ref 0.61–1.24)
GFR calc Af Amer: >60 mL/min (ref >60)
GFR calc non Af Amer: >60 mL/min (ref >60)
Glucose, Bld: 87 mg/dL (ref 70–99)
Potassium: 4.2 mmol/L (ref 3.5–5.1)
Sodium: 140 mmol/L (ref 135–145)

## 2020-01-15 LAB — TROPONIN I (HIGH SENSITIVITY)
Troponin I (High Sensitivity): 6 ng/L (ref <18)
Troponin I (High Sensitivity): 7 ng/L (ref <18)

## 2020-01-15 MED ORDER — SODIUM CHLORIDE 0.9% FLUSH: 3.0000 mL | Freq: Once | INTRAVENOUS | Status: DC

## 2020-01-15 NOTE — ED Triage Notes (Signed)
Pt arrives from [place] [facility] clinic for an abnormal EKG "second degree AV block" Pt has previous ekg in April that showed 1st degree av block. Pt has no cp or sob.

## 2020-01-15 NOTE — ED Provider Notes (Signed)
[facility] EMERGENCY DEPARTMENT Provider Note   CSN: [id number] Arrival date & time: 01/15/20  1524     History Chief Complaint  Patient presents with  . Abnormal ECG    Timothy Robertson is a 74 y.o. male.  74 year old male with prior medical history as detailed below presents from the [place] for cardiology evaluation.  Patient reports to this examiner that he was seen at the [place].  He was found to have a possible second-degree heart block.  Patient referred to [facility] health ED for cardiology evaluation for possible pacemaker placement.  Patient is without current symptoms.  He denies recent shortness of breath, chest pain, fever, syncope, or other specific complaint.  He does report intermittent episodes of feeling "lightheaded" with rapid standing over the last several weeks.    The history is provided by the patient and medical records.  Illness Location:  Suspected heart block Severity:  Mild Onset quality:  Unable to specify Timing:  Unable to specify Progression:  Resolved Chronicity:  New Associated symptoms: no abdominal pain, no chest pain and no fever        Past Medical History:  Diagnosis Date  . Arthritis   . Femoral-femoral bypass graft thrombosis, left (HCC)   . Fibromyalgia   . HOH (hard of hearing)   . Pancreatitis     Patient Active Problem List   Diagnosis Date Noted  . CAD (coronary artery disease) 07/14/2018  . Dilated cardiomyopathy (HCC) 07/13/2018  . LBBB (left bundle branch block) 07/13/2018  . 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 TIA (transient ischemic attack) 07/04/2018    Past Surgical History:  Procedure Laterality Date  . AORTO-FEMORAL BYPASS GRAFT    . CHOLECYSTECTOMY    . CORONARY ANGIOPLASTY WITH STENT PLACEMENT         Family History  Problem Relation Age of Onset  . Cancer Mother   . Stroke Father     Social History   Tobacco Use  . Smoking status: Former Smoker    Packs/day: 1.00    Years: 40.00      Pack years: 40.00    Types: Cigarettes    Quit date: 1995    Years since quitting: 26.3  . Smokeless tobacco: Never Used  Substance Use Topics  . Alcohol use: Not Currently  . Drug use: Not Currently    Home Medications Prior to Admission medications   Medication Sig Start Date End Date Taking? Authorizing Provider  ACETAMINOPHEN-BUTALBITAL 50-325 MG TABS Take 1 tablet by mouth daily as needed (for headache).   Yes [provider]  aspirin EC 81 MG tablet Take 81 mg by mouth daily.   Yes [provider]  Calcium Carb-Cholecalciferol (CALCIUM-VITAMIN D) 600-400 MG-UNIT TABS Take 1 tablet by mouth daily.   Yes [provider]  carvedilol (COREG) 25 MG tablet Take 12.5 mg by mouth 2 (two) times daily with a meal.   Yes [provider]  cetirizine (ZYRTEC ALLERGY) 10 MG tablet Take 10 mg by mouth daily as needed.    Yes [provider]  fluticasone (FLONASE) 50 MCG/ACT nasal spray Place 1 spray into both nostrils daily as needed for allergies or rhinitis.   Yes [provider]  furosemide (LASIX) 20 MG tablet Take 40 mg by mouth daily.    Yes [provider]  glipiZIDE (GLUCOTROL) 10 MG tablet Take 10 mg by mouth daily before breakfast.   Yes [provider]  ibuprofen (ADVIL) 600 MG tablet Take  1 tablet (600 mg total) by mouth every 6 (six) hours as needed. Take with food 10/26/19  Yes Avegno, Darrelyn Hillock, FNP  metFORMIN (GLUCOPHAGE) 1000 MG tablet Take 500 mg by mouth 2 (two) times daily with a meal.    Yes [provider]  Multiple Vitamin (MULTIVITAMIN WITH MINERALS) TABS tablet Take 1 tablet by mouth daily. Centrum   Yes [provider]  naproxen (NAPROSYN) 375 MG tablet Take 1 tablet (375 mg total) by mouth 2 (two) times daily. 3/81/01  Yes Delora Fuel, MD  Omega-3 Fatty Acids (FISH OIL) 1000 MG CAPS Take 1,000 mg by mouth daily.    Yes [provider]  potassium chloride (K-DUR) 10 MEQ  tablet Take 10 mEq by mouth daily.   Yes [provider]  aspirin EC 325 MG EC tablet Take 1 tablet (325 mg total) by mouth daily. Patient not taking: Reported on 01/15/2020 07/06/18   Hosie Poisson, MD  carvedilol (COREG) 6.25 MG tablet Take 1 tablet (6.25 mg total) by mouth 2 (two) times daily with a meal. Patient not taking: Reported on 01/15/2020 07/14/18   Richardo Priest, MD  methocarbamol (ROBAXIN) 500 MG tablet Take 1 tablet (500 mg total) by mouth every 6 (six) hours as needed for muscle spasms. Patient not taking: Reported on 03/17/1024 8/52/77   Delora Fuel, MD  valsartan (DIOVAN) 40 MG tablet Take 1 tablet (40 mg total) by mouth 2 (two) times daily. Patient not taking: Reported on 01/15/2020 01/02/19   Richardo Priest, MD    Allergies    Penicillins and Sulfa antibiotics  Review of Systems   Review of Systems  Constitutional: Negative for fever.  Cardiovascular: Negative for chest pain.  Gastrointestinal: Negative for abdominal pain.  All other systems reviewed and are negative.   Physical Exam Updated Vital Signs BP (!) 144/64   Pulse 60   Resp 20   SpO2 98%   Physical Exam Vitals and nursing note reviewed.  Constitutional:      General: He is not in acute distress.    Appearance: He is well-developed.  HENT:     Head: Normocephalic and atraumatic.  Eyes:     Conjunctiva/sclera: Conjunctivae normal.     Pupils: Pupils are equal, round, and reactive to light.  Cardiovascular:     Rate and Rhythm: Normal rate and regular rhythm.     Heart sounds: Normal heart sounds.  Pulmonary:     Effort: Pulmonary effort is normal. No respiratory distress.     Breath sounds: Normal breath sounds.  Abdominal:     General: There is no distension.     Palpations: Abdomen is soft.     Tenderness: There is no abdominal tenderness.  Musculoskeletal:        General: No deformity. Normal range of motion.     Cervical back: Normal range of motion and neck supple.  Skin:     General: Skin is warm and dry.  Neurological:     Mental Status: He is alert and oriented to person, place, and time.     ED Results / Procedures / Treatments   Labs (all labs ordered are listed, but only abnormal results are displayed) Labs Reviewed  BASIC METABOLIC PANEL - Abnormal; Notable for the following components:      Result Value   CO2 17 (*)    All other components within normal limits  CBC  CBG MONITORING, ED  TROPONIN I (HIGH SENSITIVITY)  TROPONIN I (HIGH SENSITIVITY)  EKG EKG Interpretation  Date/Time:  Tuesday Jan 15 2020 17:12:53 EDT Ventricular Rate:  61 PR Interval:  220 QRS Duration: 182 QT Interval:  492 QTC Calculation: 496 R Axis:   -36 Text Interpretation: Sinus rhythm Prolonged PR interval Left bundle branch block Confirmed by Kristine Royal [phone]) on 01/15/2020 5:15:11 PM   Radiology No results found.  Procedures Procedures (including critical care time)  Medications Ordered in ED Medications  sodium chloride flush (NS) 0.9 % injection 3 mL (3 mLs Intravenous Not Given 01/15/20 1720)    ED Course  I have reviewed the triage vital signs and the nursing notes.  Pertinent labs & imaging results that were available during my care of the patient were reviewed by me and considered in my medical decision making (see chart for details).    MDM Rules/Calculators/A&P                      MDM  Screen complete  Stalin Gruenberg was evaluated in Emergency Department on 01/15/2020 for the symptoms described in the history of present illness. He was evaluated in the context of the global COVID-19 pandemic, which necessitated consideration that the patient might be at risk for infection with the SARS-CoV-2 virus that causes COVID-19. Institutional protocols and algorithms that pertain to the evaluation of patients at risk for COVID-19 are in a state of rapid change based on information released by regulatory bodies including the CDC and federal and  state organizations. These policies and algorithms were followed during the patient's care in the ED.   Patient is presenting from the [place] for evaluation by cardiology.  Patient is currently asymptomatic.  Patient's providers at the [facility] were concerned about possible heart block and need for pacemaker.  Case discussed with cardiology.  Patient has been evaluated by Dr. Eldridge Dace in the ED.  Patient does not require further inpatient work-up or treatment at this time.  Patient is appropriate for outpatient cardiology follow-up.  Importance of close follow-up is stressed.  Strict return precautions given and understood.  Final Clinical Impression(s) / ED Diagnoses Final diagnoses:  1st degree AV block    Rx / DC Orders ED Discharge Orders    None       Wynetta Fines, MD 01/15/20 Windell Moment

## 2020-01-15 NOTE — Consult Note (Signed)
Cardiology Consultation:   Patient ID: Timothy Robertson MRN: [id number]; DOB: 04/03/1946  Admit date: 01/15/2020 Date of Consult: 01/15/2020  Primary Care Provider: Annetta Maw, MD Primary Cardiologist: Shirlee More, MD  Primary Electrophysiologist:  None    Patient Profile:   Timothy Robertson is a 74 y.o. male with a hx of CAD, cardiomyopathy who is being seen today for the evaluation of bradycardia at the request of Dr. Francia Greaves.  History of Present Illness:   Mr. [name] has an extensive cardiac history.  Records from Dr. Bettina Gavia show: "Timothy Robertson is a 74 y.o. male with a hx of CADwith PCI and PADlast seen 07/14/18.He has a history of myocardial infarction 20 years ago PCI and 2 stents and surprisingly has had no cardiology follow-up or interaction since that time. He is unaware of whether he had cardiac injury initially. He recently admitted to the hospital with a TIA echocardiogram done ejection fraction is severely reduced 30 to 35% with left bundle branch block.Pro BNP 130 on 07/26/18. He was last seen 10/26/18.  Unfortunately follow-up echocardiogram at the [facility] hospital showed an ejection fraction 41% and he did not qualify for Entresto and was placed on valsartan."  He has been feeling fatigued over the last several months.  Looking back at Dr. Joya Gaskins note, he had some dyspnea on exertion even in 2020.  His carvedilol was decreased and then eventually stopped he thinks about a week ago.  Presumably, this was for a slow heart rate.  Today he was seen at the [facility] and it was thought that he had a high degree heart block and he was sent to our emergency room.  Denies : Chest pain.  Leg edema. Nitroglycerin use. Orthopnea. Palpitations. Paroxysmal nocturnal dyspnea. Shortness of breath. Syncope.   He has had occasional dizziness with going from a bending position to a standing position.  He was surprised that he was sent to the emergency room.  He did not think he  was feeling that poorly.     Past Medical History:  Diagnosis Date  . Arthritis   . Femoral-femoral bypass graft thrombosis, left ([place])   . Fibromyalgia   . HOH (hard of hearing)   . Pancreatitis     Past Surgical History:  Procedure Laterality Date  . AORTO-FEMORAL BYPASS GRAFT    . CHOLECYSTECTOMY    . CORONARY ANGIOPLASTY WITH STENT PLACEMENT         Inpatient Medications: Scheduled Meds: . sodium chloride flush  3 mL Intravenous Once   Continuous Infusions:  PRN Meds:   Allergies:    Allergies  Allergen Reactions  . Penicillins     Has patient had a PCN reaction causing immediate rash, facial/tongue/throat swelling, SOB or lightheadedness with hypotension: Y Has patient had a PCN reaction causing severe rash involving mucus membranes or skin necrosis: Y Has patient had a PCN reaction that required hospitalization: Y Has patient had a PCN reaction occurring within the last 10 years: N If all of the above answers are "NO", then may proceed with Cephalosporin use.   . Sulfa Antibiotics Itching    Social History:   Social History   Socioeconomic History  . Marital status: Married    Spouse name: Not on file  . Number of children: Not on file  . Years of education: Not on file  . Highest education level: Not on file  Occupational History  . Not on file  Tobacco Use  . Smoking status: Former Smoker    Packs/day:  1.00    Years: 40.00    Pack years: 40.00    Types: Cigarettes    Quit date: 26    Years since quitting: 26.3  . Smokeless tobacco: Never Used  Substance and Sexual Activity  . Alcohol use: Not Currently  . Drug use: Not Currently  . Sexual activity: Not on file  Other Topics Concern  . Not on file  Social History Narrative  . Not on file   Social Determinants of Health   Financial Resource Strain:   . Difficulty of Paying Living Expenses:   Food Insecurity:   . Worried About Programme researcher, broadcasting/film/video in the Last Year:   . Engineer, site in the Last Year:   Transportation Needs:   . Freight forwarder (Medical):   Marland Kitchen Lack of Transportation (Non-Medical):   Physical Activity:   . Days of Exercise per Week:   . Minutes of Exercise per Session:   Stress:   . Feeling of Stress :   Social Connections:   . Frequency of Communication with Friends and Family:   . Frequency of Social Gatherings with Friends and Family:   . Attends Religious Services:   . Active Member of Clubs or Organizations:   . Attends Banker Meetings:   Marland Kitchen Marital Status:   Intimate Partner Violence:   . Fear of Current or Ex-Partner:   . Emotionally Abused:   Marland Kitchen Physically Abused:   . Sexually Abused:     Family History:    Family History  Problem Relation Age of Onset  . Cancer Mother   . Stroke Father      ROS:  Please see the history of present illness.  Generalized fatigue All other ROS reviewed and negative.     Physical Exam/Data:   Vitals:   01/15/20 1715  BP: (!) 144/64  Pulse: 60  Resp: 20  SpO2: 98%   No intake or output data in the 24 hours ending 01/15/20 1848 Last 3 Weights 01/29/2019 11/05/2018 10/26/2018  Weight (lbs) 197 lb 200 lb 202 lb 6.4 oz  Weight (kg) 89.359 kg 90.719 kg 91.808 kg     There is no height or weight on file to calculate BMI.  General:  Well nourished, well developed, in no acute distress HEENT: normal Lymph: no adenopathy Neck: no JVD Endocrine:  No thryomegaly Vascular: No carotid bruits; FA pulses 2+ bilaterally without bruits  Cardiac:  normal S1, S2; RRR; no murmur  Lungs:  clear to auscultation bilaterally, no wheezing, rhonchi or rales  Abd: soft, nontender, no hepatomegaly  Ext: no edema Musculoskeletal:  No deformities, BUE and BLE strength normal and equal Skin: warm and dry  Neuro:  CNs 2-12 intact, no focal abnormalities noted Psych:  flat affect   EKG:  The EKG was personally reviewed and demonstrates: Sinus rhythm with left bundle branch block, prolonged PR  interval Telemetry:  Telemetry was personally reviewed and demonstrates:  NSR  Relevant CV Studies:  Prior echo results as noted above Laboratory Data:  High Sensitivity Troponin:   Recent Labs  Lab 01/15/20 1529  TROPONINIHS 6     Chemistry Recent Labs  Lab 01/15/20 1529  NA 140  K 4.2  CL 111  CO2 17*  GLUCOSE 87  BUN 23  CREATININE 1.04  CALCIUM 9.5  GFRNONAA >60  GFRAA >60  ANIONGAP 12    No results for input(s): PROT, ALBUMIN, AST, ALT, ALKPHOS, BILITOT in the last 168 hours.  Hematology Recent Labs  Lab 01/15/20 1529  WBC 9.4  RBC 5.05  HGB 15.3  HCT 48.3  MCV 95.6  MCH 30.3  MCHC 31.7  RDW 14.5  PLT 216   BNPNo results for input(s): BNP, PROBNP in the last 168 hours.  DDimer No results for input(s): DDIMER in the last 168 hours.   Radiology/Studies:  No results found. {   Assessment and Plan:   1. Left bundle branch block: I personally reviewed all the strips from the [place].  There is a stretch of significant bradycardia noted.  There are clear P waves indicating sinus rhythm.  It appears there may be blocked PACs.  Even with that slow heart rate, the patient denied that he had any dizziness or presyncope.  He certainly has some degree of conduction disease given the left bundle branch block and the prolonged PR.  We will plan on a 2-week Zio patch to evaluate his heart rate at home.  No clear indication for pacemaker at this time. 2. CAD: No angina.  We will have to keep him off of beta-blocker at this time.  He had been weaning it down to lower doses.  Heart rate may pick up some depending on exactly when he took the last dose.  He thinks it was at least several days ago. 3. Hypertension: He will need nonrate slowing drugs to help with his blood pressure.  He does have a machine at home and will continue to check his blood pressure. 4. Troponin negative.  Second troponin pending.  Presumably, this will be negative and if it is negative, it should be  okay for him to go home.      For questions or updates, please contact [facility] Please consult [email or web address] for contact info under     Signed, Lance Muss, MD  01/15/2020 6:48 PM

## 2020-01-15 NOTE — Discharge Instructions (Addendum)
Please return for any problem.  Follow-up with your regular care providers as instructed.    Follow-up with [facility] cardiology as instructed.

## 2020-01-17 ENCOUNTER — Telehealth: Payer: Self-pay

## 2020-01-17 DIAGNOSIS — R001 Bradycardia, unspecified: Secondary | ICD-10-CM

## 2020-01-17 NOTE — Telephone Encounter (Signed)
-----   Message from Vallarie Mare sent at 01/17/2020 11:17 AM EDT ----- Needs a monitor --   ----- Message ----- From: Manson Passey, PA Sent: 01/15/2020   6:59 PM EDT To: [facility]acker Scheduling  Please schedule this patient for a follow-up appointment.  Primary Cardiologist: Seen by Dr. Eldridge Dace in ER But will established care in [place] or [place] office  Date of Discharge: 01/15/2020 Appointment Needed Within: 4 Weeks  Appointment Type: ER follow up    Patient also needs 2 weeks of Zio monitor   Thank you! Chelsea Aus, PA-C

## 2020-01-17 NOTE — Telephone Encounter (Signed)
Order for event monitor placed, referral enter into zio

## 2020-02-13 NOTE — Progress Notes (Signed)
Cardiology Office Note  Date: 02/14/2020   ID: Timothy Robertson, DOB 1945/10/02, MRN [id number]  PCP:  Dolan Amen, MD  Cardiologist:  Dina Rich, MD Electrophysiologist:  None   Chief Complaint: CAD, chronic systolic heart failure, symptomatic bradycardia, second-degree heart block Mobitz 2.  Recent BiV ICD secondary to decreased, hypertension, hyperlipidemia  History of Present Illness: Timothy Robertson is a 74 y.o. male with a history of CAD, chronic systolic heart failure dilated cardiomyopathy, left bundle branch block, CVA/TIA, DM type II, HTN, HLD, bradycardia femoral-femoral bypass graft thrombosis left side. Previously saw Dr Dulce Sellar cardiology [facility].  Recent placement of BiV ICD with lead revision after a fall.  Recently presented to [facility] emergency room on 01/15/2020 sent from [facility] for cardiology evaluation.  He was found to have possible second-degree heart block.  Referred to [facility] health ED for cardiology evaluation possible pacemaker placement.  He was discharged and considered appropriate for outpatient cardiology follow-up.A Zio patch was ordered and patient was to follow up in either [place] or [facility] in 2 weeks.  Patient was seen on 01/24/2020 at [facility] after transfer from [facility] hospital with complaint of shortness of breath and chest pain.  Patient had second-degree AV block Mobitz type II on arrival with symptomatic bradycardia, left bundle branch block. 2D echo during admission showed EF of 35% with global hypokinesis.  He had a biventricular defibrillator placed.  He was discharged on 01/26/2020 from [facility].  1 day after discharge he strained his ankle and fell apparently displacing one of the ICD leads.  He then went to [facility]. He had phrenic nerve stimulation states he dislodged his left ventricular lead. Was scheduled for revision of left ventricular lead.  He was  transferred to EP lab on 01/30/2020 for ICD lead revision.  Device interrogation was performed on 01/31/2020 with normal function.  He was discharged on 01/31/2020 at 10:12 AM   On presentation today patient denies any significant issues.  His ICD is in place with bandage and Steri-Strips with no evidence of swelling or redness.Marland Kitchen  He denies any shocks and has a remote device interrogator at home.  He will be following up with [facility] soon post lead revision and to recheck defibrillator.  He denies any progressive anginal or exertional symptoms but is not very active on a daily basis.  Denies any CVA or TIA-like symptoms, palpitations or arrhythmias, orthostatic symptoms, bleeding in stool or urine.  Claudication-like symptoms, DVT or PE-like symptoms, or lower extremity edema.  Past Medical History:  Diagnosis Date  . Arthritis   . Femoral-femoral bypass graft thrombosis, left ([facility])   . Fibromyalgia   . HOH (hard of hearing)   . Pancreatitis     Past Surgical History:  Procedure Laterality Date  . AORTO-FEMORAL BYPASS GRAFT    . CHOLECYSTECTOMY    . CORONARY ANGIOPLASTY WITH STENT PLACEMENT      Current Outpatient Medications  Medication Sig Dispense Refill  . aspirin EC 81 MG tablet Take 81 mg by mouth daily.    Marland Kitchen atorvastatin (LIPITOR) 80 MG tablet Take 1 tablet by mouth daily.    . butalbital-acetaminophen-caffeine (FIORICET) 50-325-40 MG tablet Take 1 tablet by mouth 3 (three) times daily as needed for headache.    . Calcium Carb-Cholecalciferol (CALCIUM-VITAMIN D) 600-400 MG-UNIT TABS Take 1 tablet by mouth daily.    . carvedilol (COREG) 6.25 MG tablet Take 1 tablet (6.25 mg total) by mouth  2 (two) times daily with a meal. 60 tablet 3  . cetirizine (ZYRTEC ALLERGY) 10 MG tablet Take 10 mg by mouth daily as needed.     . fluticasone (FLONASE) 50 MCG/ACT nasal spray Place 1 spray into both nostrils daily as needed for allergies or rhinitis.    . furosemide (LASIX) 20 MG tablet  Take 20 mg by mouth daily.     Marland Kitchen glipiZIDE (GLUCOTROL) 10 MG tablet Take 10 mg by mouth daily before breakfast.    . lisinopril (ZESTRIL) 40 MG tablet Take 40 mg by mouth daily.    . metFORMIN (GLUCOPHAGE) 1000 MG tablet Take 500 mg by mouth 2 (two) times daily with a meal.     . Multiple Vitamin (MULTIVITAMIN WITH MINERALS) TABS tablet Take 1 tablet by mouth daily. Centrum    . Omega-3 Fatty Acids (FISH OIL) 1000 MG CAPS Take 1,000 mg by mouth daily.     . potassium chloride (K-DUR) 10 MEQ tablet Take 10 mEq by mouth daily.    Marland Kitchen topiramate (TOPAMAX) 25 MG tablet Take 1 tablet by mouth 2 (two) times daily.      No current facility-administered medications for this visit.   Allergies:  Penicillins, Sulfa antibiotics, and Latex   Social History: The patient  reports that he quit smoking about 26 years ago. His smoking use included cigarettes. He has a 40.00 pack-year smoking history. He has never used smokeless tobacco. He reports previous alcohol use. He reports previous drug use.   Family History: The patient's family history includes Cancer in his mother; Stroke in his father.   ROS:  Please see the history of present illness. Otherwise, complete review of systems is positive for none.  All other systems are reviewed and negative.   Physical Exam: VS:  BP 120/60   Pulse 66   Ht 5\' 6"  (1.676 m)   Wt 179 lb 12.8 oz (81.6 kg)   SpO2 98%   BMI 29.02 kg/m , BMI Body mass index is 29.02 kg/m.  Wt Readings from Last 3 Encounters:  02/14/20 179 lb 12.8 oz (81.6 kg)  01/29/19 197 lb (89.4 kg)  11/05/18 200 lb (90.7 kg)    General: Patient appears comfortable at rest. Neck: Supple, no elevated JVP or carotid bruits, no thyromegaly. Lungs: Clear to auscultation, nonlabored breathing at rest. Cardiac: Regular rate and rhythm, no S3 or significant systolic murmur, no pericardial rub. Extremities: No pitting edema, distal pulses 2+. Skin: Warm and dry.  ICD insertion site clean and dry with  Steri-Strips in place.  No redness or swelling. Musculoskeletal: No kyphosis. Neuropsychiatric: Alert and oriented x3, affect grossly appropriate.  ECG:  Last recorded EKG available check on 01/15/2020 showed sinus rhythm with first-degree AV block left bundle branch block rate of 68.    Recent Labwork: 01/15/2020: BUN 23; Creatinine, Ser 1.04; Hemoglobin 15.3; Platelets 216; Potassium 4.2; Sodium 140     Component Value Date/Time   CHOL 117 07/04/2018 0235   TRIG 147 07/04/2018 0235   HDL 23 (L) 07/04/2018 0235   CHOLHDL 5.1 07/04/2018 0235   VLDL 29 07/04/2018 0235   LDLCALC 65 07/04/2018 0235    Other Studies Reviewed Today:  Echocardiogram 01/24/2020 Indication; symptomatic bradycardic; secondary AV block Mobitz type II; heart block heart rate 41 SUMMARY The left ventricle is moderately dilated. There is normal left ventricular wall thickness.Left ventricular systolic function is moderately reduced. LV ejection fraction = 35-40%. There is moderate global hypokinesis of the left ventricle. The  right ventricle is normal in size and function. Focally calcified mitral leaflet. There is mild to moderate mitral regurgitation. Structurally normal tricuspid valve. There is mild tricuspid regurgitation. The aortic sinus is normal size. The inferior vena cava was not visualized during the exam. There is no pericardial effusion.  There is no comparison study available.  - FINDINGS: LEFT VENTRICLE The left ventricle is moderately dilated. There is normal left ventricular wall thickness. LV ejection fraction = 35-40%. Left ventricular systolic function is moderately reduced. There is moderate global hypokinesis of the left ventricle.  - RIGHT VENTRICLE The right ventricle is normal in size and function.  LEFT ATRIUM The left atrium is mildly dilated.  RIGHT ATRIUM Right atrial size is normal. - AORTIC VALVE Structurally normal aortic valve. There is no aortic stenosis.  There is no aortic regurgitation. - MITRAL VALVE Focally calcified mitral leaflet. There is mild to moderate mitral regurgitation. - TRICUSPID VALVE Structurally normal tricuspid valve. There is mild tricuspid regurgitation. - PULMONIC VALVE The pulmonic valve is not well visualized. - ARTERIES The aortic sinus is normal size. - VENOUS The inferior vena cava was not visualized during the exam. - EFFUSION There is no pericardial effusion. - -  MMode/2D Measurements & Calculations IVSd: 1.1 cm LA dim: 4.5 cm ESV(MOD-sp4): LVOT diam: LVIDd: 6.0 cm EDV(MOD-sp4): 86.3 ml 2.1 cm LVPWd: 1.1 cm 146.0 ml EDV(MOD-sp2): LVIDs: 5.1 cm 161.0 ml ESV(MOD-sp2): 75.6 ml  _______________________________________________________________________ SV(MOD-sp4): EF A4C: 41.1 % SV A4C: 60.2 ml 59.7 ml SI(MOD-sp4): 30.8 ml/m2  Doppler Measurements & Calculations MV E max vel: Ao V2 max: TR max vel: 159.1 cm/sec 120.9 cm/sec MR ERO: 0.02 cm2 274.8 cm/sec MV A max vel: Ao max PG: MR PISA radius: TR max PG: 107.3 cm/sec 5.8 mmHg 0.39 cm 30.2 mmHg MV E/A: 1.5 MR alias vel: RVSP(TR): 14.0 cm/sec 35.2 mmHg   _______________________________________________________________________ RAP systole: 5.0 mmHg  _______________________________________________________________________ Reading Physician: MD Kenney Houseman, [phone] 01/24/2020 01:27 PM     Assessment and Plan:  1. CAD in native artery   2. Cardiomyopathy, unspecified type (HCC)   3. Mobitz type 2 second degree heart block   4. Biventricular ICD (implantable cardioverter-defibrillator) in place   5. Type 2 diabetes mellitus with complication, without long-term current use of insulin (HCC)    1. CAD in native artery Denies any progressive anginal or exertional symptoms.  Continue aspirin 81 mg  2. Cardiomyopathy, unspecified type (HCC) Echocardiogram 01/24/2020 showed moderately dilated LV EF 35 to 40% with moderate global hypokinesis.   Mild to moderate MR mild TR.  Continue carvedilol 6.25 mg p.o. twice daily, continue lisinopril 40 mg daily.  Continue Lasix 20 mg daily  3. Mobitz type 2 second degree heart block Recent symptomatic bradycardia with evidence of secondary heart block Mobitz type II.  4. Biventricular ICD (implantable cardioverter-defibrillator) in place Recent insertion of biventricular ICD at [facility] with lead revision after displacement subsequent to insertion.  Patient has a remote device for daily remote checking.  He has a follow-up with [facility] in a few weeks to recheck device and wound incision.  5. Type 2 diabetes mellitus with complication, without long-term current use of insulin (HCC) Managed by PCP.  Patient is taking Metformin 1000 mg p.o. twice daily and glipizide 10 mg daily.   Medication Adjustments/Labs and Tests Ordered: Current medicines are reviewed at length with the patient today.  Concerns regarding medicines are outlined above.   Disposition: Follow-up with Dr. Wyline Mood or APP 3 months  Signed, Rennis Harding, NP 02/14/2020 10:35 AM    [facility] at [facility] [address] [place], [place], [place] [address] Phone: [phone]; Fax: [phone]

## 2020-02-14 ENCOUNTER — Ambulatory Visit (INDEPENDENT_AMBULATORY_CARE_PROVIDER_SITE_OTHER): Payer: Medicare Other | Admitting: Family Medicine

## 2020-02-14 ENCOUNTER — Encounter: Payer: Self-pay | Admitting: Family Medicine

## 2020-02-14 ENCOUNTER — Other Ambulatory Visit: Payer: Self-pay

## 2020-02-14 VITALS — BP 120/60 | HR 66 | Ht 66.0 in | Wt 179.8 lb

## 2020-02-14 DIAGNOSIS — Z95810 Presence of automatic (implantable) cardiac defibrillator: Secondary | ICD-10-CM

## 2020-02-14 DIAGNOSIS — I441 Atrioventricular block, second degree: Secondary | ICD-10-CM

## 2020-02-14 DIAGNOSIS — I2510 Atherosclerotic heart disease of native coronary artery without angina pectoris: Secondary | ICD-10-CM

## 2020-02-14 DIAGNOSIS — I429 Cardiomyopathy, unspecified: Secondary | ICD-10-CM | POA: Diagnosis not present

## 2020-02-14 DIAGNOSIS — E118 Type 2 diabetes mellitus with unspecified complications: Secondary | ICD-10-CM

## 2020-02-14 NOTE — Patient Instructions (Addendum)
Medication Instructions:   Your physician recommends that you continue on your current medications as directed. Please refer to the Current Medication list given to you today.  Labwork:  NONE  Testing/Procedures:  NONE  Follow-Up:  Your physician recommends that you schedule a follow-up appointment in: 3 months (office).  Any Other Special Instructions Will Be Listed Below (If Applicable).  If you need a refill on your cardiac medications before your next appointment, please call your pharmacy. 

## 2020-05-20 ENCOUNTER — Ambulatory Visit: Payer: No Typology Code available for payment source | Admitting: Cardiology

## 2020-08-28 ENCOUNTER — Emergency Department (HOSPITAL_COMMUNITY): Payer: No Typology Code available for payment source

## 2020-08-28 ENCOUNTER — Encounter (HOSPITAL_COMMUNITY): Payer: Self-pay

## 2020-08-28 ENCOUNTER — Emergency Department (HOSPITAL_COMMUNITY)
Admission: EM | Admit: 2020-08-28 | Discharge: 2020-08-28 | Disposition: A | Discharge status: Home / Self Care | Payer: No Typology Code available for payment source | Attending: Emergency Medicine | Admitting: Emergency Medicine

## 2020-08-28 DIAGNOSIS — I2510 Atherosclerotic heart disease of native coronary artery without angina pectoris: Secondary | ICD-10-CM | POA: Diagnosis not present

## 2020-08-28 DIAGNOSIS — Z91040 Latex allergy status: Secondary | ICD-10-CM | POA: Insufficient documentation

## 2020-08-28 DIAGNOSIS — Z7982 Long term (current) use of aspirin: Secondary | ICD-10-CM | POA: Insufficient documentation

## 2020-08-28 DIAGNOSIS — R197 Diarrhea, unspecified: Secondary | ICD-10-CM | POA: Diagnosis not present

## 2020-08-28 DIAGNOSIS — R1084 Generalized abdominal pain: Secondary | ICD-10-CM | POA: Insufficient documentation

## 2020-08-28 DIAGNOSIS — Z87891 Personal history of nicotine dependence: Secondary | ICD-10-CM | POA: Diagnosis not present

## 2020-08-28 DIAGNOSIS — R1012 Left upper quadrant pain: Secondary | ICD-10-CM | POA: Diagnosis present

## 2020-08-28 LAB — BASIC METABOLIC PANEL
Anion gap: 10 (ref 5–15)
BUN: 25 mg/dL — ABNORMAL HIGH (ref 8–23)
CO2: 20 mmol/L — ABNORMAL LOW (ref 22–32)
Calcium: 9.5 mg/dL (ref 8.9–10.3)
Chloride: 107 mmol/L (ref 98–111)
Creatinine, Ser: 0.96 mg/dL (ref 0.61–1.24)
GFR, Estimated: >60 mL/min (ref >60)
Glucose, Bld: 117 mg/dL — ABNORMAL HIGH (ref 70–99)
Potassium: 4.5 mmol/L (ref 3.5–5.1)
Sodium: 137 mmol/L (ref 135–145)

## 2020-08-28 LAB — CBC
HCT: 48.3 % (ref 39.0–52.0)
Hemoglobin: 16 g/dL (ref 13.0–17.0)
MCH: 30.5 pg (ref 26.0–34.0)
MCHC: 33.1 g/dL (ref 30.0–36.0)
MCV: 92 fL (ref 80.0–100.0)
Platelets: 220 10*3/uL (ref 150–400)
RBC: 5.25 MIL/uL (ref 4.22–5.81)
RDW: 13.8 % (ref 11.5–15.5)
WBC: 13.3 10*3/uL — ABNORMAL HIGH (ref 4.0–10.5)
nRBC: 0 % (ref 0.0–0.2)

## 2020-08-28 LAB — HEPATIC FUNCTION PANEL
ALT: 45 U/L — ABNORMAL HIGH (ref 0–44)
AST: 75 U/L — ABNORMAL HIGH (ref 15–41)
Albumin: 4 g/dL (ref 3.5–5.0)
Alkaline Phosphatase: 82 U/L (ref 38–126)
Bilirubin, Direct: 0.3 mg/dL — ABNORMAL HIGH (ref 0.0–0.2)
Indirect Bilirubin: 0.6 mg/dL (ref 0.3–0.9)
Total Bilirubin: 0.9 mg/dL (ref 0.3–1.2)
Total Protein: 7.2 g/dL (ref 6.5–8.1)

## 2020-08-28 LAB — TROPONIN I (HIGH SENSITIVITY): Troponin I (High Sensitivity): 7 ng/L (ref <18)

## 2020-08-28 LAB — LIPASE, BLOOD: Lipase: 69 U/L — ABNORMAL HIGH (ref 11–51)

## 2020-08-28 MED ORDER — IOHEXOL 300 MG/ML  SOLN
100.0000 mL | Freq: Once | INTRAMUSCULAR | Status: AC | PRN
  Administered 2020-08-28: 100 mL via INTRAVENOUS

## 2020-08-28 MED ORDER — ONDANSETRON HCL 4 MG PO TABS
4.0000 mg | ORAL_TABLET | Freq: Three times a day (TID) | ORAL | 0 refills | Status: DC | PRN
Start: 2020-08-28 — End: 2021-07-02

## 2020-08-28 MED ORDER — FENTANYL CITRATE (PF) 100 MCG/2ML IJ SOLN
50.0000 ug | Freq: Once | INTRAMUSCULAR | Status: AC
  Administered 2020-08-28: 50 ug via INTRAVENOUS
  Filled 2020-08-28: qty 2

## 2020-08-28 MED ORDER — SODIUM CHLORIDE (PF) 0.9 % IJ SOLN
INTRAMUSCULAR | Status: AC
  Filled 2020-08-28: qty 50

## 2020-08-28 NOTE — ED Provider Notes (Signed)
[facility]-EMERGENCY DEPT Provider Note   CSN: [id number] Arrival date & time: 08/28/20  0847     History Chief Complaint  Patient presents with  . Abdominal Pain    Timothy Robertson is a 74 y.o. male.  The history is provided by the patient.  Abdominal Pain Pain location:  LUQ, LLQ and epigastric Pain quality: aching   Pain radiates to:  Does not radiate Pain severity:  Mild Onset quality:  Gradual Timing:  Intermittent Progression:  Waxing and waning Chronicity:  New Context: not eating   Relieved by:  Nothing Worsened by:  Nothing Associated symptoms: chest pain (epigastric?) and diarrhea   Associated symptoms: no chills, no constipation, no cough, no dysuria, no fever, no hematuria, no nausea, no shortness of breath, no sore throat and no vomiting        Past Medical History:  Diagnosis Date  . Arthritis   . Femoral-femoral bypass graft thrombosis, left (HCC)   . Fibromyalgia   . HOH (hard of hearing)   . Pancreatitis     Patient Active Problem List   Diagnosis Date Noted  . CAD (coronary artery disease) 07/14/2018  . Dilated cardiomyopathy (HCC) 07/13/2018  . LBBB (left bundle branch block) 07/13/2018  . 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 TIA (transient ischemic attack) 07/04/2018    Past Surgical History:  Procedure Laterality Date  . AORTO-FEMORAL BYPASS GRAFT    . CHOLECYSTECTOMY    . CORONARY ANGIOPLASTY WITH STENT PLACEMENT         Family History  Problem Relation Age of Onset  . Cancer Mother   . Stroke Father     Social History   Tobacco Use  . Smoking status: Former Smoker    Packs/day: 1.00    Years: 40.00    Pack years: 40.00    Types: Cigarettes    Quit date: 1995    Years since quitting: 26.9  . Smokeless tobacco: Never Used  Vaping Use  . Vaping Use: Never used  Substance Use Topics  . Alcohol use: Not Currently  . Drug use: Not Currently    Home Medications Prior to Admission  medications   Medication Sig Start Date End Date Taking? Authorizing Provider  aspirin EC 81 MG tablet Take 81 mg by mouth daily.   Yes [provider]  atorvastatin (LIPITOR) 80 MG tablet Take 80 mg by mouth daily.   Yes [provider]  butalbital-acetaminophen-caffeine (FIORICET) 50-325-40 MG tablet Take 1 tablet by mouth 3 (three) times daily as needed for headache.   Yes [provider]  Calcium Carb-Cholecalciferol (CALCIUM-VITAMIN D) 600-400 MG-UNIT TABS Take 1 tablet by mouth daily.   Yes [provider]  carvedilol (COREG) 6.25 MG tablet Take 1 tablet (6.25 mg total) by mouth 2 (two) times daily with a meal. 07/14/18  Yes Baldo Daub, MD  cetirizine (ZYRTEC) 10 MG tablet Take 10 mg by mouth daily as needed for allergies.   Yes [provider]  furosemide (LASIX) 20 MG tablet Take 20 mg by mouth daily.    Yes [provider]  glipiZIDE (GLUCOTROL) 10 MG tablet Take 10 mg by mouth daily before breakfast.   Yes [provider]  lisinopril (ZESTRIL) 40 MG tablet Take 40 mg by mouth daily.   Yes [provider]  metFORMIN (GLUCOPHAGE) 1000 MG tablet Take 500 mg by mouth 2 (two) times daily with a meal.    Yes [provider]  Multiple Vitamin (MULTIVITAMIN  WITH MINERALS) TABS tablet Take 1 tablet by mouth daily. Centrum   Yes [provider]  Omega-3 Fatty Acids (FISH OIL) 1000 MG CAPS Take 1,000 mg by mouth daily.    Yes [provider]  potassium chloride (K-DUR) 10 MEQ tablet Take 10 mEq by mouth daily.   Yes [provider]  topiramate (TOPAMAX) 25 MG tablet Take 1 tablet by mouth 2 (two) times daily.    Yes [provider]  ondansetron (ZOFRAN) 4 MG tablet Take 1 tablet (4 mg total) by mouth every 8 (eight) hours as needed for up to 15 doses for nausea or vomiting. 08/28/20   Mikle Sternberg, DO    Allergies    Penicillins, Sulfa antibiotics, and Latex  Review of  Systems   Review of Systems  Constitutional: Negative for chills and fever.  HENT: Negative for ear pain and sore throat.   Eyes: Negative for pain and visual disturbance.  Respiratory: Negative for cough and shortness of breath.   Cardiovascular: Positive for chest pain (epigastric?). Negative for palpitations.  Gastrointestinal: Positive for abdominal pain and diarrhea. Negative for abdominal distention, anal bleeding, blood in stool, constipation, nausea and vomiting.  Genitourinary: Negative for dysuria and hematuria.  Musculoskeletal: Negative for arthralgias and back pain.  Skin: Negative for color change and rash.  Neurological: Negative for seizures and syncope.  All other systems reviewed and are negative.   Physical Exam Updated Vital Signs  ED Triage Vitals  Enc Vitals Group     BP 08/28/20 0855 133/63     Pulse Rate 08/28/20 0855 60     Resp 08/28/20 0855 16     Temp --      Temp Source 08/28/20 0855 Oral     SpO2 08/28/20 0855 96 %     Weight --      Height --      Head Circumference --      Peak Flow --      Pain Score 08/28/20 0903 0     Pain Loc --      Pain Edu? --      Excl. in GC? --     Physical Exam Vitals and nursing note reviewed.  Constitutional:      General: He is not in acute distress.    Appearance: He is well-developed and well-nourished. He is not ill-appearing.  HENT:     Head: Normocephalic and atraumatic.     Mouth/Throat:     Mouth: Mucous membranes are moist.  Eyes:     Extraocular Movements: Extraocular movements intact.     Conjunctiva/sclera: Conjunctivae normal.  Cardiovascular:     Rate and Rhythm: Normal rate and regular rhythm.     Heart sounds: Normal heart sounds. No murmur heard.   Pulmonary:     Effort: Pulmonary effort is normal. No respiratory distress.     Breath sounds: Normal breath sounds.  Abdominal:     General: Abdomen is flat.     Palpations: Abdomen is soft.     Tenderness: There is abdominal  tenderness in the epigastric area, left upper quadrant and left lower quadrant. There is no guarding.  Musculoskeletal:        General: No edema.     Cervical back: Neck supple.  Skin:    General: Skin is warm and dry.     Capillary Refill: Capillary refill takes less than 2 seconds.  Neurological:     General: No focal deficit present.  Mental Status: He is alert.  Psychiatric:        Mood and Affect: Mood and affect normal.     ED Results / Procedures / Treatments   Labs (all labs ordered are listed, but only abnormal results are displayed) Labs Reviewed  BASIC METABOLIC PANEL - Abnormal; Notable for the following components:      Result Value   CO2 20 (*)    Glucose, Bld 117 (*)    BUN 25 (*)    All other components within normal limits  CBC - Abnormal; Notable for the following components:   WBC 13.3 (*)    All other components within normal limits  HEPATIC FUNCTION PANEL - Abnormal; Notable for the following components:   AST 75 (*)    ALT 45 (*)    Bilirubin, Direct 0.3 (*)    All other components within normal limits  LIPASE, BLOOD - Abnormal; Notable for the following components:   Lipase 69 (*)    All other components within normal limits  TROPONIN I (HIGH SENSITIVITY)    EKG  Sinus rhythm, no ischemic changes.  Unchanged from prior EKG, normal intervals  Radiology CT ABDOMEN PELVIS W CONTRAST  Result Date: 08/28/2020 CLINICAL DATA:  Lower abdominal pain. EXAM: CT ABDOMEN AND PELVIS WITH CONTRAST TECHNIQUE: Multidetector CT imaging of the abdomen and pelvis was performed using the standard protocol following bolus administration of intravenous contrast. CONTRAST:  OMNIPAQUE IOHEXOL 300 MG/ML  SOLN COMPARISON:  None. FINDINGS: Lower chest: Interstitial changes noted with subpleural reticulation in the lung bases. Hepatobiliary: No suspicious focal abnormality within the liver parenchyma. Gallbladder is surgically absent. No intrahepatic or extrahepatic  biliary dilation. Pancreas: No focal mass lesion. No dilatation of the main duct. No intraparenchymal cyst. No peripancreatic edema. Spleen: No splenomegaly. No focal mass lesion. Adrenals/Urinary Tract: No adrenal nodule or mass. Kidneys unremarkable. No evidence for hydroureter. The urinary bladder appears normal for the degree of distention. Stomach/Bowel: Stomach is unremarkable. No gastric wall thickening. No evidence of outlet obstruction. Duodenum is normally positioned as is the ligament of Treitz. No small bowel wall thickening. No small bowel dilatation. The terminal ileum is normal. The appendix is normal. No gross colonic mass. No colonic wall thickening. Diverticular changes are noted in the left colon without evidence of diverticulitis. Fluid in the rectum suggests diarrhea. Vascular/Lymphatic: Atherosclerotic calcification noted abdominal aorta with fusiform dilatation up to 3.6 cm diameter. There is no gastrohepatic or hepatoduodenal ligament lymphadenopathy. No retroperitoneal or mesenteric lymphadenopathy. No pelvic sidewall lymphadenopathy. Reproductive: The prostate gland and seminal vesicles are unremarkable. Other: No intraperitoneal free fluid. Musculoskeletal: Small bilateral groin hernias contain only fat. No worrisome lytic or sclerotic osseous abnormality. IMPRESSION: 1. No acute findings in the abdomen or pelvis. Specifically, no findings to explain the patient's history of lower abdominal pain. 2. Diffuse intraluminal fluid in the colon, including the rectum suggests diarrhea. 3. Left colonic diverticulosis without diverticulitis. 4. 3.6 cm fusiform infrarenal abdominal aortic aneurysm. Recommend follow-up ultrasound every 2 years. This recommendation follows ACR consensus guidelines: White Paper of the ACR Incidental Findings Committee II on Vascular Findings. J Am Coll Radiol 2013; 88:502-774. 5. Aortic Atherosclerosis (ICD10-I70.0). Electronically Signed   By: Kennith Center M.D.   On:  08/28/2020 11:03   DG Chest Portable 1 View  Result Date: 08/28/2020 CLINICAL DATA:  Chest pain. EXAM: PORTABLE CHEST 1 VIEW COMPARISON:  01/31/2020 FINDINGS: 0931 hours. Low volumes. Cardiopericardial silhouette is at upper limits of normal for size. Interstitial  markings are diffusely coarsened with chronic features. Left permanent pacemaker/AICD noted. Telemetry leads overlie the chest. IMPRESSION: Stable. Chronic interstitial coarsening. No acute cardiopulmonary findings. Electronically Signed   By: Kennith CenterEric  Mansell M.D.   On: 08/28/2020 09:44    Procedures Procedures (including critical care time)  Medications Ordered in ED Medications  fentaNYL (SUBLIMAZE) injection 50 mcg (50 mcg Intravenous Given 08/28/20 0938)  sodium chloride (PF) 0.9 % injection (  Given by Other 08/28/20 1112)  iohexol (OMNIPAQUE) 300 MG/ML solution 100 mL (100 mLs Intravenous Contrast Given 08/28/20 1048)    ED Course  I have reviewed the triage vital signs and the nursing notes.  Pertinent labs & imaging results that were available during my care of the patient were reviewed by me and considered in my medical decision making (see chart for details).    MDM Rules/Calculators/A&P                          Timothy Robertson is a 74 year old male with history of fibromyalgia, pancreatitis who presents to the ED with abdominal pain.  Pain mostly in the upper abdomen the left side.  No history of kidney stones.  Is having diarrhea but no nausea or vomiting.  Pain started overnight.  Pain is coming and going and may be is associated when he is having the feeling to have bowel movement.  Thought maybe he was having chest pain but seems to be in the upper abdomen.  He is tender on exam in the epigastric region and left side.  Possibly diverticulitis versus kidney stone versus gastritis versus pancreatitis.  EKG shows sinus rhythm.  No obvious ischemic changes.  Will get a troponin but doubt cardiac process.  Will check  lab work including lipase and will get CT scan abdomen and pelvis.  Will give pain medication and reevaluate.  Denies urinary symptoms.  Patient with normal troponin.  Doubt cardiac process.  No significant anemia, electrolyte abnormality.  Gallbladder and liver function within normal limits.  No pancreatitis or cholecystitis.  CT scan overall consistent with diarrheal process.  Otherwise incidental 3.6 cm aortic aneurysm that he will follow-up ultrasounds with.  Patient feeling better after IV fluids and Zofran.  Will continue symptomatic treatment outpatient.  Understands return precautions.  Overall suspect foodborne illness.  This chart was dictated using voice recognition software.  Despite best efforts to proofread,  errors can occur which can change the documentation meaning.     Final Clinical Impression(s) / ED Diagnoses Final diagnoses:  Diarrhea, unspecified type    Rx / DC Orders ED Discharge Orders         Ordered    ondansetron (ZOFRAN) 4 MG tablet  Every 8 hours PRN        08/28/20 1129           Virgina NorfolkCuratolo, Tatsuo Musial, DO 08/28/20 1131

## 2020-08-28 NOTE — ED Triage Notes (Signed)
Pt arrived via walk in, c/o lower abd pain, states he had an episode of chest pain earlier this morning, pain now all in lower stomach. Also endorsing some diarrhea. All started about 1am this morning. Denies any current chest pain, denies any n/v.

## 2020-08-28 NOTE — Discharge Instructions (Addendum)
Also make sure to talk about the bone incidental finding CT scan with your primary care doctor.  You do have a 3.6 cm fusiform infrarenal abdominal aortic aneurysm. Recommend follow-up ultrasound every 2 years.  Please keep a simple diet for the next several days as your diarrhea resolves.

## 2021-07-02 ENCOUNTER — Encounter (HOSPITAL_COMMUNITY): Payer: Self-pay

## 2021-07-02 ENCOUNTER — Observation Stay (HOSPITAL_COMMUNITY)
Admission: EM | Admit: 2021-07-02 | Discharge: 2021-07-02 | Disposition: A | Discharge status: Home / Self Care | Payer: No Typology Code available for payment source | Attending: Internal Medicine | Admitting: Internal Medicine

## 2021-07-02 ENCOUNTER — Observation Stay (HOSPITAL_COMMUNITY): Payer: No Typology Code available for payment source

## 2021-07-02 ENCOUNTER — Emergency Department (HOSPITAL_COMMUNITY): Payer: No Typology Code available for payment source

## 2021-07-02 DIAGNOSIS — Z79899 Other long term (current) drug therapy: Secondary | ICD-10-CM | POA: Diagnosis not present

## 2021-07-02 DIAGNOSIS — Z7984 Long term (current) use of oral hypoglycemic drugs: Secondary | ICD-10-CM | POA: Diagnosis not present

## 2021-07-02 DIAGNOSIS — R7989 Other specified abnormal findings of blood chemistry: Secondary | ICD-10-CM | POA: Diagnosis present

## 2021-07-02 DIAGNOSIS — Z87891 Personal history of nicotine dependence: Secondary | ICD-10-CM | POA: Insufficient documentation

## 2021-07-02 DIAGNOSIS — Z95810 Presence of automatic (implantable) cardiac defibrillator: Secondary | ICD-10-CM | POA: Diagnosis not present

## 2021-07-02 DIAGNOSIS — Z20822 Contact with and (suspected) exposure to covid-19: Secondary | ICD-10-CM | POA: Insufficient documentation

## 2021-07-02 DIAGNOSIS — R0602 Shortness of breath: Secondary | ICD-10-CM | POA: Diagnosis not present

## 2021-07-02 DIAGNOSIS — I25118 Atherosclerotic heart disease of native coronary artery with other forms of angina pectoris: Secondary | ICD-10-CM

## 2021-07-02 DIAGNOSIS — I255 Ischemic cardiomyopathy: Secondary | ICD-10-CM | POA: Diagnosis not present

## 2021-07-02 DIAGNOSIS — Z7982 Long term (current) use of aspirin: Secondary | ICD-10-CM | POA: Insufficient documentation

## 2021-07-02 DIAGNOSIS — R0789 Other chest pain: Secondary | ICD-10-CM | POA: Diagnosis not present

## 2021-07-02 DIAGNOSIS — E1169 Type 2 diabetes mellitus with other specified complication: Secondary | ICD-10-CM | POA: Diagnosis present

## 2021-07-02 DIAGNOSIS — I2510 Atherosclerotic heart disease of native coronary artery without angina pectoris: Secondary | ICD-10-CM | POA: Diagnosis not present

## 2021-07-02 DIAGNOSIS — R079 Chest pain, unspecified: Secondary | ICD-10-CM

## 2021-07-02 DIAGNOSIS — I739 Peripheral vascular disease, unspecified: Secondary | ICD-10-CM | POA: Diagnosis present

## 2021-07-02 DIAGNOSIS — I447 Left bundle-branch block, unspecified: Secondary | ICD-10-CM | POA: Diagnosis present

## 2021-07-02 DIAGNOSIS — I714 Abdominal aortic aneurysm, without rupture, unspecified: Secondary | ICD-10-CM | POA: Diagnosis not present

## 2021-07-02 DIAGNOSIS — Z8669 Personal history of other diseases of the nervous system and sense organs: Secondary | ICD-10-CM

## 2021-07-02 DIAGNOSIS — R9431 Abnormal electrocardiogram [ECG] [EKG]: Secondary | ICD-10-CM | POA: Diagnosis not present

## 2021-07-02 DIAGNOSIS — Z91040 Latex allergy status: Secondary | ICD-10-CM | POA: Diagnosis not present

## 2021-07-02 DIAGNOSIS — E785 Hyperlipidemia, unspecified: Secondary | ICD-10-CM | POA: Diagnosis present

## 2021-07-02 LAB — RESP PANEL BY RT-PCR (FLU A&B, COVID) ARPGX2
Influenza A by PCR: NEGATIVE
Influenza B by PCR: NEGATIVE
SARS Coronavirus 2 by RT PCR: NEGATIVE

## 2021-07-02 LAB — I-STAT CHEM 8, ED
BUN: 28 mg/dL — ABNORMAL HIGH (ref 8–23)
Calcium, Ion: 1.28 mmol/L (ref 1.15–1.40)
Chloride: 109 mmol/L (ref 98–111)
Creatinine, Ser: 1 mg/dL (ref 0.61–1.24)
Glucose, Bld: 111 mg/dL — ABNORMAL HIGH (ref 70–99)
HCT: 45 % (ref 39.0–52.0)
Hemoglobin: 15.3 g/dL (ref 13.0–17.0)
Potassium: 4 mmol/L (ref 3.5–5.1)
Sodium: 142 mmol/L (ref 135–145)
TCO2: 21 mmol/L — ABNORMAL LOW (ref 22–32)

## 2021-07-02 LAB — CBC WITH DIFFERENTIAL/PLATELET
Abs Immature Granulocytes: 0.03 10*3/uL (ref 0.00–0.07)
Basophils Absolute: 0.1 10*3/uL (ref 0.0–0.1)
Basophils Relative: 1 %
Eosinophils Absolute: 0.4 10*3/uL (ref 0.0–0.5)
Eosinophils Relative: 5 %
HCT: 46.5 % (ref 39.0–52.0)
Hemoglobin: 15.2 g/dL (ref 13.0–17.0)
Immature Granulocytes: 0 %
Lymphocytes Relative: 33 %
Lymphs Abs: 2.8 10*3/uL (ref 0.7–4.0)
MCH: 30.4 pg (ref 26.0–34.0)
MCHC: 32.7 g/dL (ref 30.0–36.0)
MCV: 93 fL (ref 80.0–100.0)
Monocytes Absolute: 1 10*3/uL (ref 0.1–1.0)
Monocytes Relative: 12 %
Neutro Abs: 4.1 10*3/uL (ref 1.7–7.7)
Neutrophils Relative %: 49 %
Platelets: 189 10*3/uL (ref 150–400)
RBC: 5 MIL/uL (ref 4.22–5.81)
RDW: 13.5 % (ref 11.5–15.5)
WBC: 8.4 10*3/uL (ref 4.0–10.5)
nRBC: 0 % (ref 0.0–0.2)

## 2021-07-02 LAB — TROPONIN I (HIGH SENSITIVITY)
Troponin I (High Sensitivity): 7 ng/L (ref <18)
Troponin I (High Sensitivity): 5 ng/L (ref <18)

## 2021-07-02 LAB — D-DIMER, QUANTITATIVE: D-Dimer, Quant: 1.74 ug/mL-FEU — ABNORMAL HIGH (ref 0.00–0.50)

## 2021-07-02 LAB — BRAIN NATRIURETIC PEPTIDE: B Natriuretic Peptide: 51.9 pg/mL (ref 0.0–100.0)

## 2021-07-02 MED ORDER — NITROGLYCERIN 2 % TD OINT
0.5000 [in_us] | TOPICAL_OINTMENT | Freq: Once | TRANSDERMAL | Status: AC
  Administered 2021-07-02: 0.5 [in_us] via TOPICAL
  Filled 2021-07-02: qty 1

## 2021-07-02 MED ORDER — TOPIRAMATE 25 MG PO TABS: 25.0000 mg | ORAL_TABLET | Freq: Two times a day (BID) | ORAL | Status: DC

## 2021-07-02 MED ORDER — FUROSEMIDE 20 MG PO TABS: 40.0000 mg | ORAL_TABLET | Freq: Every morning | ORAL | Status: DC

## 2021-07-02 MED ORDER — ACETAMINOPHEN 325 MG PO TABS: 650.0000 mg | ORAL_TABLET | ORAL | Status: DC | PRN

## 2021-07-02 MED ORDER — LISINOPRIL 20 MG PO TABS: 40.0000 mg | ORAL_TABLET | Freq: Every day | ORAL | Status: DC

## 2021-07-02 MED ORDER — ATORVASTATIN CALCIUM 80 MG PO TABS: 80.0000 mg | ORAL_TABLET | Freq: Every day | ORAL | Status: DC

## 2021-07-02 MED ORDER — CARVEDILOL 3.125 MG PO TABS: 6.2500 mg | ORAL_TABLET | Freq: Two times a day (BID) | ORAL | Status: DC

## 2021-07-02 MED ORDER — POLYVINYL ALCOHOL 1.4 % OP SOLN: 1.0000 [drp] | OPHTHALMIC | Status: DC | PRN

## 2021-07-02 MED ORDER — GLYCERIN (PF) 0.25 % OP SOLN: 1.0000 [drp] | Freq: Three times a day (TID) | OPHTHALMIC | Status: DC | PRN

## 2021-07-02 MED ORDER — ASPIRIN 81 MG PO CHEW
324.0000 mg | CHEWABLE_TABLET | Freq: Once | ORAL | Status: AC
  Administered 2021-07-02: 324 mg via ORAL
  Filled 2021-07-02: qty 4

## 2021-07-02 MED ORDER — LORATADINE 10 MG PO TABS: 10.0000 mg | ORAL_TABLET | Freq: Every day | ORAL | Status: DC

## 2021-07-02 MED ORDER — BUTALBITAL-APAP-CAFFEINE 50-325-40 MG PO TABS: 1.0000 | ORAL_TABLET | Freq: Three times a day (TID) | ORAL | Status: DC | PRN

## 2021-07-02 MED ORDER — ONDANSETRON HCL 4 MG/2ML IJ SOLN: 4.0000 mg | Freq: Four times a day (QID) | INTRAMUSCULAR | Status: DC | PRN

## 2021-07-02 MED ORDER — ASPIRIN EC 81 MG PO TBEC: 81.0000 mg | DELAYED_RELEASE_TABLET | Freq: Every day | ORAL | Status: DC

## 2021-07-02 MED ORDER — ENOXAPARIN SODIUM 40 MG/0.4ML IJ SOSY: 40.0000 mg | PREFILLED_SYRINGE | INTRAMUSCULAR | Status: DC

## 2021-07-02 NOTE — ED Provider Notes (Signed)
[facility] EMERGENCY DEPARTMENT Provider Note   CSN: [id number] Arrival date & time: 07/02/21  3329     History Chief Complaint  Patient presents with   Chest Pain    Timothy Robertson Due is a 75 y.o. male.  The history is provided by the patient.  Chest Pain Pain location:  Substernal area Pain quality: dull   Pain quality comment:  And shocking sensation.  Patient thought he had been shocked by his AICD Pain radiates to:  Does not radiate Pain severity:  Moderate Onset quality:  Sudden Duration:  4 minutes Timing:  Constant Progression:  Resolved Chronicity:  Recurrent Context: at rest   Context comment:  Has had worsening Dyspnea on exertion. Relieved by:  Nothing Worsened by:  Nothing Ineffective treatments:  None tried Associated symptoms: AICD problem, lower extremity edema and shortness of breath   Associated symptoms: no diaphoresis, no nausea and no vomiting   Risk factors: coronary artery disease and male sex       Past Medical History:  Diagnosis Date   Arthritis    Femoral-femoral bypass graft thrombosis, left (HCC)    Fibromyalgia    HOH (hard of hearing)    Pancreatitis     Patient Active Problem List   Diagnosis Date Noted   CAD (coronary artery disease) 07/14/2018   Dilated cardiomyopathy (HCC) 07/13/2018   LBBB (left bundle branch block) 07/13/2018   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   TIA (transient ischemic attack) 07/04/2018    Past Surgical History:  Procedure Laterality Date   AORTO-FEMORAL BYPASS GRAFT     CHOLECYSTECTOMY     CORONARY ANGIOPLASTY WITH STENT PLACEMENT         Family History  Problem Relation Age of Onset   Cancer Mother    Stroke Father     Social History   Tobacco Use   Smoking status: Former    Packs/day: 1.00    Years: 40.00    Pack years: 40.00    Types: Cigarettes    Quit date: 1995    Years since quitting: 27.8   Smokeless tobacco: Never  Vaping Use   Vaping Use: Never  used  Substance Use Topics   Alcohol use: Not Currently   Drug use: Not Currently    Home Medications Prior to Admission medications   Medication Sig Start Date End Date Taking? Authorizing Provider  aspirin EC 81 MG tablet Take 81 mg by mouth daily.    [provider]  atorvastatin (LIPITOR) 80 MG tablet Take 80 mg by mouth daily.    [provider]  butalbital-acetaminophen-caffeine (FIORICET) 50-325-40 MG tablet Take 1 tablet by mouth 3 (three) times daily as needed for headache.    [provider]  Calcium Carb-Cholecalciferol (CALCIUM-VITAMIN D) 600-400 MG-UNIT TABS Take 1 tablet by mouth daily.    [provider]  carvedilol (COREG) 6.25 MG tablet Take 1 tablet (6.25 mg total) by mouth 2 (two) times daily with a meal. 07/14/18   Munley, Iline Oven, MD  cetirizine (ZYRTEC) 10 MG tablet Take 10 mg by mouth daily as needed for allergies.    [provider]  furosemide (LASIX) 20 MG tablet Take 20 mg by mouth daily.     [provider]  glipiZIDE (GLUCOTROL) 10 MG tablet Take 10 mg by mouth daily before breakfast.    [provider]  lisinopril (ZESTRIL) 40 MG tablet Take 40 mg by mouth daily.    [provider]  metFORMIN (GLUCOPHAGE)  1000 MG tablet Take 500 mg by mouth 2 (two) times daily with a meal.     [provider]  Multiple Vitamin (MULTIVITAMIN WITH MINERALS) TABS tablet Take 1 tablet by mouth daily. Centrum    [provider]  Omega-3 Fatty Acids (FISH OIL) 1000 MG CAPS Take 1,000 mg by mouth daily.     [provider]  ondansetron (ZOFRAN) 4 MG tablet Take 1 tablet (4 mg total) by mouth every 8 (eight) hours as needed for up to 15 doses for nausea or vomiting. 08/28/20   Curatolo, Adam, DO  potassium chloride (K-DUR) 10 MEQ tablet Take 10 mEq by mouth daily.    [provider]  topiramate (TOPAMAX) 25 MG tablet Take 1 tablet by mouth 2 (two) times daily.     [provider]    Allergies    Penicillins, Sulfa antibiotics, and Latex  Review of Systems   Review of Systems  Constitutional:  Negative for diaphoresis.  HENT:  Negative for facial swelling.   Eyes:  Negative for redness.  Respiratory:  Positive for shortness of breath.   Cardiovascular:  Positive for chest pain.  Gastrointestinal:  Negative for nausea and vomiting.  Genitourinary:  Negative for difficulty urinating.  Musculoskeletal:  Negative for neck stiffness.  Skin:  Negative for rash.  Neurological:  Negative for facial asymmetry.  Psychiatric/Behavioral:  Negative for agitation.   All other systems reviewed and are negative.  Physical Exam Updated Vital Signs BP (!) 143/64   Pulse (!) 55   Temp 97.8 F (36.6 C) (Oral)   Resp 11   SpO2 97%   Physical Exam Vitals and nursing note reviewed.  Constitutional:      Appearance: Normal appearance. He is not diaphoretic.  HENT:     Head: Normocephalic and atraumatic.     Nose: Nose normal.  Eyes:     Conjunctiva/sclera: Conjunctivae normal.     Pupils: Pupils are equal, round, and reactive to light.  Cardiovascular:     Rate and Rhythm: Normal rate and regular rhythm.     Pulses: Normal pulses.     Heart sounds: Normal heart sounds.  Pulmonary:     Effort: Pulmonary effort is normal.     Breath sounds: Normal breath sounds.  Abdominal:     General: Abdomen is flat. Bowel sounds are normal.     Palpations: Abdomen is soft.     Tenderness: There is no abdominal tenderness. There is no guarding.  Musculoskeletal:        General: Normal range of motion.     Cervical back: Normal range of motion and neck supple.  Skin:    General: Skin is warm and dry.     Capillary Refill: Capillary refill takes less than 2 seconds.  Neurological:     General: No focal deficit present.     Mental Status: He is alert and oriented to person, place, and time.     Deep Tendon Reflexes: Reflexes normal.  Psychiatric:        Mood  and Affect: Mood normal.        Behavior: Behavior normal.    ED Results / Procedures / Treatments   Labs (all labs ordered are listed, but only abnormal results are displayed) Results for orders placed or performed during the hospital encounter of 07/02/21  Resp Panel by RT-PCR (Flu A&B, Covid) Nasopharyngeal Swab   Specimen: Nasopharyngeal Swab; Nasopharyngeal(NP) swabs in vial transport medium  Result Value Ref Range  SARS Coronavirus 2 by RT PCR NEGATIVE NEGATIVE   Influenza A by PCR NEGATIVE NEGATIVE   Influenza B by PCR NEGATIVE NEGATIVE  CBC with Differential/Platelet  Result Value Ref Range   WBC 8.4 4.0 - 10.5 K/uL   RBC 5.00 4.22 - 5.81 MIL/uL   Hemoglobin 15.2 13.0 - 17.0 g/dL   HCT 30.1 60.1 - 09.3 %   MCV 93.0 80.0 - 100.0 fL   MCH 30.4 26.0 - 34.0 pg   MCHC 32.7 30.0 - 36.0 g/dL   RDW 23.5 57.3 - 22.0 %   Platelets 189 150 - 400 K/uL   nRBC 0.0 0.0 - 0.2 %   Neutrophils Relative % 49 %   Neutro Abs 4.1 1.7 - 7.7 K/uL   Lymphocytes Relative 33 %   Lymphs Abs 2.8 0.7 - 4.0 K/uL   Monocytes Relative 12 %   Monocytes Absolute 1.0 0.1 - 1.0 K/uL   Eosinophils Relative 5 %   Eosinophils Absolute 0.4 0.0 - 0.5 K/uL   Basophils Relative 1 %   Basophils Absolute 0.1 0.0 - 0.1 K/uL   Immature Granulocytes 0 %   Abs Immature Granulocytes 0.03 0.00 - 0.07 K/uL  I-stat chem 8, ED (not at [facility] or [facility])  Result Value Ref Range   Sodium 142 135 - 145 mmol/L   Potassium 4.0 3.5 - 5.1 mmol/L   Chloride 109 98 - 111 mmol/L   BUN 28 (H) 8 - 23 mg/dL   Creatinine, Ser 2.54 0.61 - 1.24 mg/dL   Glucose, Bld 270 (H) 70 - 99 mg/dL   Calcium, Ion 6.23 7.62 - 1.40 mmol/L   TCO2 21 (L) 22 - 32 mmol/L   Hemoglobin 15.3 13.0 - 17.0 g/dL   HCT 83.1 51.7 - 61.6 %  Troponin I (High Sensitivity)  Result Value Ref Range   Troponin I (High Sensitivity) 7 <18 ng/L   DG Chest Portable 1 View  Result Date: 07/02/2021 CLINICAL DATA:  Reason for exam: pain, etc Patient was laying in bed  and Defib went off. Patient denies and current CP or SOB. But patient also states that over the last week he has noticed a change in his breathing EXAM: PORTABLE CHEST - 1 VIEW COMPARISON:  01/29/2021 FINDINGS: Stable left subclavian AICD. Coarse prominent interstitial markings particularly in the perihilar regions and lung bases, left greater than right, stable. No new infiltrate or overt edema. Heart size and mediastinal contours are within normal limits. Aortic Atherosclerosis (ICD10-170.0). No effusion.  No pneumothorax. Visualized bones unremarkable. IMPRESSION: Chronic interstitial lung disease without acute abnormality. Electronically Signed   By: Corlis Leak M.D.   On: 07/02/2021 05:13     EKG EKG Interpretation  Date/Time:  Thursday July 02 2021 04:41:27 EDT Ventricular Rate:  60 PR Interval:  53 QRS Duration: 127 QT Interval:  446 QTC Calculation: 446 R Axis:   -33 Text Interpretation: Sinus rhythm Short PR interval LVH with secondary repolarization abnormality ischemia inferiorly and laterally Confirmed by Nicanor Alcon, Annemarie Sebree ([id number]) on 07/02/2021 4:59:29 AM  Radiology DG Chest Portable 1 View  Result Date: 07/02/2021 CLINICAL DATA:  Reason for exam: pain, etc Patient was laying in bed and Defib went off. Patient denies and current CP or SOB. But patient also states that over the last week he has noticed a change in his breathing EXAM: PORTABLE CHEST - 1 VIEW COMPARISON:  01/29/2021 FINDINGS: Stable left subclavian AICD. Coarse prominent interstitial markings particularly in the perihilar regions and lung bases, left  greater than right, stable. No new infiltrate or overt edema. Heart size and mediastinal contours are within normal limits. Aortic Atherosclerosis (ICD10-170.0). No effusion.  No pneumothorax. Visualized bones unremarkable. IMPRESSION: Chronic interstitial lung disease without acute abnormality. Electronically Signed   By: Corlis Leak M.D.   On: 07/02/2021 05:13     Procedures Procedures   Medications Ordered in ED Medications  aspirin chewable tablet 324 mg (has no administration in time range)  nitroGLYCERIN (NITROGLYN) 2 % ointment 0.5 inch (has no administration in time range)     ED Course  I have reviewed the triage vital signs and the nursing notes.  Pertinent labs & imaging results that were available during my care of the patient were reviewed by me and considered in my medical decision making (see chart for details).   555 seen in the ED by Dr. Irene Limbo of cardiology.  AICD was interrogated and there was no shock delivered.  No events since July   627 case d/w cardiology PA, cardiology will round on patient  Admit to medicine.  Case d/w Dr. Leafy Half of medicine.  [facility] to admit    Timothy Robertson was evaluated in Emergency Department on 07/02/2021 for the symptoms described in the history of present illness. He was evaluated in the context of the global COVID-19 pandemic, which necessitated consideration that the patient might be at risk for infection with the SARS-CoV-2 virus that causes COVID-19. Institutional protocols and algorithms that pertain to the evaluation of patients at risk for COVID-19 are in a state of rapid change based on information released by regulatory bodies including the CDC and federal and state organizations. These policies and algorithms were followed during the patient's care in the ED.  Final Clinical Impression(s) / ED Diagnoses Final diagnoses:  Chest pain, unspecified type  Abnormal EKG   Admit to medicine  Rx / DC Orders ED Discharge Orders     None        Quynh Basso, MD 07/02/21 4403

## 2021-07-02 NOTE — ED Triage Notes (Signed)
Patient was laying in bed and Defib went off. Patient denies and current CP or SOB. But patient also states that over the last week he has noticed a change in his breathing  EMS: BP: 163/73 HR: 80

## 2021-07-02 NOTE — Discharge Summary (Addendum)
Physician Discharge Summary  Timothy Robertson TOI:[id number] DOB: 01-Mar-1946 DOA: 07/02/2021  PCP: Dolan Amen, MD  Admit date: 07/02/2021 Discharge date: 07/02/2021  Recommendations for Outpatient Follow-up:  Follow-up with cardiologist at the [facility] hospital on 10/26 in regards to T wave inversion to be new from previous EKGs available per our records. Follow-up in regards to remembering elevated D-dimer.  Low suspicion for PE.   Discharge Diagnoses:  Atypical chest  Discharge Condition: Stable Disposition: Home  Diet recommendation: Heart healthy carb modified  Filed Weights   07/02/21 1059  Weight: 84.8 kg    History of present illness:  Timothy Robertson is a 75 y.o. male with medical history significant of peripheral artery disease with femoropopliteal bypass, CAD with 2 stents in the remote past, migraines, hyperlipidemia, diabetes mellitus type 2, hypertension, AAA, history of DVT, Symptomatic bradycardia status post ICD placement presenting with complaints of chest pain that felt like his defibrillator firing.  Patient admits that when he sleeps he tosses and turns normally and last night he was awoken out of his sleep with a sharp left-sided chest pain.  Pain resolved on its own over the next couple of minutes.  The area is tender to palpation and he thinks that he may have just rolled over his fibrillator to cause symptoms.Denied having any radiation of pain, shortness of breath, diaphoresis, leg swelling, abdominal pain, nausea, or vomiting.    He reports that his weight is stable at 187 pounds and his right leg is always been a little bit bigger than his left since he had femoral bypass.  He has a follow-up appointment with his cardiologist at the [facility] hospital next Wednesday.   ED Course: On admission to the emergency department patient was seen to be afebrile, pulse 55-63, blood pressures maintained, and all other vital signs maintained.  Labs significant for  high-sensitivity troponins negative x2.  Chest x-ray noted chronic interstitial lung disease without acute abnormality.  Influenza and COVID-19 screening was negative.  Cardiology had been consulted and reported ICD was interrogated, but noted to show no signs of firing.  EKG with inferior lateral T wave inversion.  This is new compared to EKG.  Patient had been given full dose aspirin and nitroglycerin ointment placed.   Hospital Course:  Atypical chest pain: Patient presents with complaints of feeling as though his defibrillator fired.  However, on physical exam patient with tenderness palpation over generator site of his ICD. Marland Kitchen  Patient had been given full dose aspirin in the ED. cardiology did formally evaluate the patient and suspect symptoms were likely musculoskeletal in nature given negative cardiac enzymes and physical exam findings.  CAD: s/p remote stenting.  High-sensitivity troponins negative x2. -Continue aspirin, statin, beta-blocker   HFrEF s/p biventricular ICD: Chronic.  Patient reports weight is stable at 187 pounds with no lower extremity swelling or signs of JVD on physical exam.  Appears to be euvolemic at this time.  Last EF noted to be 30-35% with grade 2 diastolic dysfunction back in 06/2018, but had been 50-55% the previous month.  Stress testing performed in 07/2018 was reported to be normal or stable result for which cardiac cath was not recommended at that time.  BNP was within normal limits at 51.9.  EKG did note new inferior lateral T wave inversion.  -Cardiology recommended follow-up with his cardiologist at the [facility] hospital scheduled for 10/26  Elevated D-dimer: Acute D-dimer was noted to be elevated at 1.74, but patient without any other significant signs or  symptoms to give [place] concern for DVT or PE. -Follow-up with outpatient provider   Diabetes mellitus type 2: On admission glucose 111.  Diabetes appears to be relatively well controlled.  Home medications include  metformin and glipizide. -Continue medications of metformin and glipizide at discharge   Peripheral vascular disease: Patient status post bypass grafting. -Continue aspirin and statin   Migraine headaches -Continue topiramate   Hyperlipidemia -Continue atorvastatin   AAA: Patient has a abdominal aortic aneurysm measured to be 3.5 cm during hospitalization at [facility] in 01/2021. -Recommended repeat ultrasound imaging in 2 years     Discharge Instructions   Allergies as of 07/02/2021       Reactions   Penicillins Hives   Has patient had a PCN reaction causing immediate rash, facial/tongue/throat swelling, SOB or lightheadedness with hypotension: Y Has patient had a PCN reaction causing severe rash involving mucus membranes or skin necrosis: Y Has patient had a PCN reaction that required hospitalization: Y Has patient had a PCN reaction occurring within the last 10 years: N If all of the above answers are "NO", then may proceed with Cephalosporin use.   Sulfa Antibiotics Itching   Iron Rash, Other (See Comments)   Makes the skin break out   Latex Rash   Liver Rash, Other (See Comments)   Causes the skin to break out        Medication List     STOP taking these medications    ondansetron 4 MG tablet Commonly known as: ZOFRAN       TAKE these medications    aspirin EC 81 MG tablet Take 81 mg by mouth daily.   atorvastatin 80 MG tablet Commonly known as: LIPITOR Take 80 mg by mouth at bedtime.   butalbital-acetaminophen-caffeine 50-325-40 MG tablet Commonly known as: FIORICET Take 1 tablet by mouth 3 (three) times daily as needed for headache.   CALCIUM 600+D3 PO Take 1 tablet by mouth daily.   carvedilol 6.25 MG tablet Commonly known as: COREG Take 1 tablet (6.25 mg total) by mouth 2 (two) times daily with a meal.   Centrum Silver 50+Men Tabs Take 1 tablet by mouth daily with breakfast.   cetirizine 10 MG tablet Commonly known as: ZYRTEC Take 10 mg by  mouth daily.   Clear Eyes Pure Relief PF 0.25 % Soln Generic drug: Glycerin (PF) Place 1-2 drops into both eyes 3 (three) times daily as needed (for irritation).   Fish Oil 1000 MG Caps Take 1,000 mg by mouth daily.   furosemide 20 MG tablet Commonly known as: LASIX Take 40 mg by mouth in the morning.   glipiZIDE 10 MG tablet Commonly known as: GLUCOTROL Take 10 mg by mouth daily before breakfast.   lisinopril 40 MG tablet Commonly known as: ZESTRIL Take 40 mg by mouth daily.   metFORMIN 500 MG 24 hr tablet Commonly known as: GLUCOPHAGE-XR Take 500 mg by mouth in the morning and at bedtime.   potassium chloride 10 MEQ tablet Commonly known as: KLOR-CON Take 10 mEq by mouth in the morning.   topiramate 25 MG tablet Commonly known as: TOPAMAX Take 25 mg by mouth in the morning and at bedtime.       Allergies  Allergen Reactions   Penicillins Hives    Has patient had a PCN reaction causing immediate rash, facial/tongue/throat swelling, SOB or lightheadedness with hypotension: Y Has patient had a PCN reaction causing severe rash involving mucus membranes or skin necrosis: Y Has patient had a  PCN reaction that required hospitalization: Y Has patient had a PCN reaction occurring within the last 10 years: N If all of the above answers are "NO", then may proceed with Cephalosporin use.    Sulfa Antibiotics Itching   Iron Rash and Other (See Comments)    Makes the skin break out   Latex Rash   Liver Rash and Other (See Comments)    Causes the skin to break out    The results of significant diagnostics from this hospitalization (including imaging, microbiology, ancillary and laboratory) are listed below for reference.    Significant Diagnostic Studies: DG Chest Portable 1 View  Result Date: 07/02/2021 CLINICAL DATA:  Reason for exam: pain, etc Patient was laying in bed and Defib went off. Patient denies and current CP or SOB. But patient also states that over the last  week he has noticed a change in his breathing EXAM: PORTABLE CHEST - 1 VIEW COMPARISON:  01/29/2021 FINDINGS: Stable left subclavian AICD. Coarse prominent interstitial markings particularly in the perihilar regions and lung bases, left greater than right, stable. No new infiltrate or overt edema. Heart size and mediastinal contours are within normal limits. Aortic Atherosclerosis (ICD10-170.0). No effusion.  No pneumothorax. Visualized bones unremarkable. IMPRESSION: Chronic interstitial lung disease without acute abnormality. Electronically Signed   By: Corlis Leak M.D.   On: 07/02/2021 05:13    Microbiology: Recent Results (from the past 240 hour(s))  Resp Panel by RT-PCR (Flu A&B, Covid) Nasopharyngeal Swab     Status: None   Collection Time: 07/02/21  5:17 AM   Specimen: Nasopharyngeal Swab; Nasopharyngeal(NP) swabs in vial transport medium  Result Value Ref Range Status   SARS Coronavirus 2 by RT PCR NEGATIVE NEGATIVE Final    Comment: (NOTE) SARS-CoV-2 target nucleic acids are NOT DETECTED.  The SARS-CoV-2 RNA is generally detectable in upper respiratory specimens during the acute phase of infection. The lowest concentration of SARS-CoV-2 viral copies this assay can detect is 138 copies/mL. A negative result does not preclude SARS-Cov-2 infection and should not be used as the sole basis for treatment or other patient management decisions. A negative result may occur with  improper specimen collection/handling, submission of specimen other than nasopharyngeal swab, presence of viral mutation(s) within the areas targeted by this assay, and inadequate number of viral copies(<138 copies/mL). A negative result must be combined with clinical observations, patient history, and epidemiological information. The expected result is Negative.  Fact Sheet for Patients:  BloggerCourse.com  Fact Sheet for Healthcare Providers:   SeriousBroker.it  This test is no t yet approved or cleared by the [place] FDA and  has been authorized for detection and/or diagnosis of SARS-CoV-2 by FDA under an Emergency Use Authorization (EUA). This EUA will remain  in effect (meaning this test can be used) for the duration of the COVID-19 declaration under Section 564(b)(1) of the Act, 21 U.S.C.section 360bbb-3(b)(1), unless the authorization is terminated  or revoked sooner.       Influenza A by PCR NEGATIVE NEGATIVE Final   Influenza B by PCR NEGATIVE NEGATIVE Final    Comment: (NOTE) The Xpert Xpress SARS-CoV-2/FLU/RSV plus assay is intended as an aid in the diagnosis of influenza from Nasopharyngeal swab specimens and should not be used as a sole basis for treatment. Nasal washings and aspirates are unacceptable for Xpert Xpress SARS-CoV-2/FLU/RSV testing.  Fact Sheet for Patients: BloggerCourse.com  Fact Sheet for Healthcare Providers: SeriousBroker.it  This test is not yet approved or cleared by the [place]  States FDA and has been authorized for detection and/or diagnosis of SARS-CoV-2 by FDA under an Emergency Use Authorization (EUA). This EUA will remain in effect (meaning this test can be used) for the duration of the COVID-19 declaration under Section 564(b)(1) of the Act, 21 U.S.C. section 360bbb-3(b)(1), unless the authorization is terminated or revoked.  Performed at [facility], [address] [place], [place] [address]      Labs: Basic Metabolic Panel: Recent Labs  Lab 07/02/21 0508  NA 142  K 4.0  CL 109  GLUCOSE 111*  BUN 28*  CREATININE 1.00   Liver Function Tests: No results for input(s): AST, ALT, ALKPHOS, BILITOT, PROT, ALBUMIN in the last 168 hours. No results for input(s): LIPASE, AMYLASE in the last 168 hours. No results for input(s): AMMONIA in the last 168 hours. CBC: Recent Labs  Lab  07/02/21 0455 07/02/21 0508  WBC 8.4  --   NEUTROABS 4.1  --   HGB 15.2 15.3  HCT 46.5 45.0  MCV 93.0  --   PLT 189  --    Cardiac Enzymes: No results for input(s): CKTOTAL, CKMB, CKMBINDEX, TROPONINI in the last 168 hours. BNP: BNP (last 3 results) Recent Labs    07/02/21 1026  BNP 51.9    ProBNP (last 3 results) No results for input(s): PROBNP in the last 8760 hours.  CBG: No results for input(s): GLUCAP in the last 168 hours.  Principal Problem:   Chest pain Active Problems:   LBBB (left bundle branch block)   CAD (coronary artery disease)   Type 2 diabetes mellitus with hyperlipidemia (HCC)   Hx of migraine headaches   PVD (peripheral vascular disease) (HCC)   AAA (abdominal aortic aneurysm)   Elevated d-dimer   Time coordinating discharge: 30 minutes  Signed:  Brendia Sacks, MD [facility] 07/02/2021, 4:33 PM

## 2021-07-02 NOTE — Consult Note (Signed)
Cardiology Consultation:   Patient ID: Timothy Robertson MRN: [id number]; DOB: July 16, 1946  Admit date: 07/02/2021 Date of Consult: 07/02/2021  PCP:  Dolan Amen, MD   [facility] Providers Cardiologist: Lenn Sink Previously followed by Dr. Dulce Sellar.  Also by Dr. Rudolpho Sevin.   Patient Profile:   Timothy Robertson is a 75 y.o. male with a hx of CAD, ischemic cardiomyopathy, chronic systolic heart failure with improved LV function, history of symptomatic bradycardia, left bundle branch block s/p BiV ICD, hypertension, hyperlipidemia, stroke, diabetes mellitus and former smoker who is being seen 07/02/2021 for the evaluation of chest pain at the request of Katrinka Blazing.  History of CAD with remote stent placement x2.  Unknown last cardiac catheterization.  Last stress test November 2019 was intermediate risk stress due to EF of 36% with findings consistent with prior myocardial infarction.  No acute ischemia.  Echocardiogram October 2019: LVEF 30 to 35% and grade 2 diastolic dysfunction.  Moderate mitral regurgitation.  Patient was seen on 01/24/2020 at [facility] after transfer from [facility] hospital with complaint of shortness of breath and chest pain.  Patient had second-degree AV block Mobitz type II on arrival with symptomatic bradycardia, left bundle branch block. 2D echo during admission showed EF of 35% with global hypokinesis.  He had a biventricular defibrillator placed.  He was discharged on 01/26/2020 from [facility].  1 day after discharge he strained his ankle and fell apparently displacing one of the ICD leads.  He then went to [facility]. He had phrenic nerve stimulation states he dislodged his left ventricular lead. Was scheduled for revision of left ventricular lead.  He was transferred to EP lab on 01/30/2020 for ICD lead revision.    Echo 05/2020: LVEF 50 to 55%, moderate mitral regurgitation  History of Present  Illness:   Mr. Zanders brought in by EMS for evaluation of chest pain.  Patient was sleeping at home and rolled over his ICD in unusual position.  He had acute onset chest pain at ICD site.  He thought this was due to firing.  No associated shortness of breath, diaphoresis, nausea or vomiting.  No radiation of pain.  Symptoms lasted for 3 to 4 minutes.  Wife has called EMS and brought here for further evaluation.  No recurrent pain since then.  High-sensitivity troponin x2 negative BNP 51 Potassium 4.0 Creatinine normal D-dimer elevated at 1.74 Chest x-ray with chronic interstitial lung disease without acute abnormality  Per ER note, "ICD was interrogated and there was no shock delivered".  No actual report available for review.  I have asked multiple ER providers and staff.  Patient is very active at baseline.  He walks 30 minutes every day.  He does yard work as well.  No exertional chest pain or shortness of breath.  Denies palpitation, orthopnea, PND, syncope, lower extremity edema or melena.  Follow-up with cardiologist at [facility] next week.   Past Medical History:  Diagnosis Date   Arthritis    Femoral-femoral bypass graft thrombosis, left (HCC)    Fibromyalgia    HOH (hard of hearing)    Pancreatitis     Past Surgical History:  Procedure Laterality Date   AORTO-FEMORAL BYPASS GRAFT     CHOLECYSTECTOMY     CORONARY ANGIOPLASTY WITH STENT PLACEMENT       Inpatient Medications: Scheduled Meds:  enoxaparin (LOVENOX) injection  40 mg Subcutaneous Q24H   Continuous Infusions:  PRN Meds: acetaminophen, ondansetron (ZOFRAN) IV  Allergies:    Allergies  Allergen Reactions   Penicillins Hives    Has patient had a PCN reaction causing immediate rash, facial/tongue/throat swelling, SOB or lightheadedness with hypotension: Y Has patient had a PCN reaction causing severe rash involving mucus membranes or skin necrosis: Y Has patient had a PCN reaction that required  hospitalization: Y Has patient had a PCN reaction occurring within the last 10 years: N If all of the above answers are "NO", then may proceed with Cephalosporin use.    Sulfa Antibiotics Itching   Iron Rash and Other (See Comments)    Makes the skin break out   Latex Rash   Liver Rash and Other (See Comments)    Causes the skin to break out    Social History:   Social History   Socioeconomic History   Marital status: Married    Spouse name: Not on file   Number of children: Not on file   Years of education: Not on file   Highest education level: Not on file  Occupational History   Not on file  Tobacco Use   Smoking status: Former    Packs/day: 1.00    Years: 40.00    Pack years: 40.00    Types: Cigarettes    Quit date: 27    Years since quitting: 27.8   Smokeless tobacco: Never  Vaping Use   Vaping Use: Never used  Substance and Sexual Activity   Alcohol use: Not Currently   Drug use: Not Currently   Sexual activity: Not on file  Other Topics Concern   Not on file  Social History Narrative   Not on file   Social Determinants of Health   Financial Resource Strain: Not on file  Food Insecurity: Not on file  Transportation Needs: Not on file  Physical Activity: Not on file  Stress: Not on file  Social Connections: Not on file  Intimate Partner Violence: Not on file    Family History:   Family History  Problem Relation Age of Onset   Cancer Mother    Stroke Father      ROS:  Please see the history of present illness.  All other ROS reviewed and negative.     Physical Exam/Data:   Vitals:   07/02/21 0734 07/02/21 0932 07/02/21 1059 07/02/21 1226  BP: 140/66 140/77  138/75  Pulse: 60 60  63  Resp: 17 16  16   Temp: 97.8 F (36.6 C) 99.9 F (37.7 C)  98.2 F (36.8 C)  TempSrc: Oral Oral  Oral  SpO2: 100% 100%  98%  Weight:   84.8 kg   Height:   5\' 8"  (1.727 m)     Intake/Output Summary (Last 24 hours) at 07/02/2021 1510 Last data filed at  07/02/2021 0600 Gross per 24 hour  Intake --  Output 150 ml  Net -150 ml   Last 3 Weights 07/02/2021 02/14/2020 01/29/2019  Weight (lbs) 187 lb 179 lb 12.8 oz 197 lb  Weight (kg) 84.823 kg 81.557 kg 89.359 kg     Body mass index is 28.43 kg/m.  General:  Well nourished, well developed, in no acute distress HEENT: normal Neck: no JVD Vascular: No carotid bruits; Distal pulses 2+ bilaterally Cardiac:  normal S1, S2; RRR; no murmur  Lungs:  clear to auscultation bilaterally, no wheezing, rhonchi or rales  Abd: soft, nontender, no hepatomegaly  Ext: no edema Musculoskeletal:  No deformities, BUE and BLE strength normal and equal Skin: warm and dry  Neuro:  CNs 2-12 intact, no focal abnormalities noted Psych:  Normal affect   EKG:  The EKG was personally reviewed and demonstrates: Sinus rhythm at rate of 60, inferior lateral T wave inversion Telemetry:  Telemetry was personally reviewed and demonstrates: Sinus rhythm  Relevant CV Studies:  Echo 06/06/2020 SUMMARY CPS of 01/24/20 EF has improved. The left ventricular size is normal. There is normal left ventricular wall thickness. Left ventricular systolic function is low normal. LV ejection fraction = 50-55%. There is basal LV inferolateral wall mild hypokinesis The left ventricular filling pattern is norma for age. Device lead in the right ventricle The left atrium is mildly dilated. Device lead in the right atrium. There is mild aortic valve sclerosis noted, with no evidence of stenosis. Focally calcified mitral leaflet. There is no evidence of stenosis. There is moderate mitral regurgitation. The regurgitant jet is wall-impinging. There is mild tricuspid regurgitation. Estimated right ventricular systolic pressure is 34 mmHg. CPS of 01/24/20 EF has improved  - FINDINGS: LEFT VENTRICLE The left ventricular size is normal. There is normal left ventricular wall thickness. Left ventricular systolic function is low normal.  LV ejection fraction = 50-55%. The left ventricular filling pattern is norma for age. Mitral inflow deceleration timeNormal>150 msec. There is basal LV inferolateral wall mild hypokinesis.  - RIGHT VENTRICLE The right ventricle is normal size. There is normal right ventricular wall thickness. Device lead in the right ventricle. The right ventricular systolic function is normal.  LEFT ATRIUM The left atrial volume is mildly increased. LA vol index: 38.6 ml/m2. The left atrium is mildly dilated.  RIGHT ATRIUM Right atrial size is normal. Device lead in the right atrium. There is no Doppler evidence for a patent foramen ovale. - AORTIC VALVE The aortic valve is trileaflet. The aortic valve opens well. There is mild aortic valve sclerosis noted, with no evidence of stenosis. There is no aortic regurgitation. - MITRAL VALVE There is mild mitral annular calcification. Focally calcified mitral leaflet. There is no evidence of stenosis. There is moderate mitral regurgitation. The regurgitant jet is wall-impinging. - TRICUSPID VALVE Structurally normal tricuspid valve. There is no tricuspid stenosis. There is mild tricuspid regurgitation. Estimated right ventricular systolic pressure is 34 mmHg. - PULMONIC VALVE The pulmonic valve is normal in structure and function. There is no pulmonic valvular stenosis. There is no pulmonic valvular regurgitation. - ARTERIES The aortic sinus is normal size. The ascending aorta is normal size. - VENOUS Pulmonary venous flow pattern is normal. IVC size was normal. - EFFUSION Pericardial effusion is located posteriorly. There is no pleural effusion. - -   Laboratory Data:  High Sensitivity Troponin:   Recent Labs  Lab 07/02/21 0455 07/02/21 0645  TROPONINIHS 7 5     Chemistry Recent Labs  Lab 07/02/21 0508  NA 142  K 4.0  CL 109  GLUCOSE 111*  BUN 28*  CREATININE 1.00     Hematology Recent Labs  Lab 07/02/21 0455  07/02/21 0508  WBC 8.4  --   RBC 5.00  --   HGB 15.2 15.3  HCT 46.5 45.0  MCV 93.0  --   MCH 30.4  --   MCHC 32.7  --   RDW 13.5  --   PLT 189  --    Thyroid No results for input(s): TSH, FREET4 in the last 168 hours.  BNP Recent Labs  Lab 07/02/21 1026  BNP 51.9    DDimer  Recent Labs  Lab 07/02/21 1026  DDIMER 1.74*  Radiology/Studies:  DG Chest Portable 1 View  Result Date: 07/02/2021 CLINICAL DATA:  Reason for exam: pain, etc Patient was laying in bed and Defib went off. Patient denies and current CP or SOB. But patient also states that over the last week he has noticed a change in his breathing EXAM: PORTABLE CHEST - 1 VIEW COMPARISON:  01/29/2021 FINDINGS: Stable left subclavian AICD. Coarse prominent interstitial markings particularly in the perihilar regions and lung bases, left greater than right, stable. No new infiltrate or overt edema. Heart size and mediastinal contours are within normal limits. Aortic Atherosclerosis (ICD10-170.0). No effusion.  No pneumothorax. Visualized bones unremarkable. IMPRESSION: Chronic interstitial lung disease without acute abnormality. Electronically Signed   By: Corlis Leak M.D.   On: 07/02/2021 05:13     Assessment and Plan:   Chest pain His pain is consistent with musculoskeletal in etiology.  Likely related to unusual sleep position.  Initial thought was ICD shock by patient however per ER note no ICD shocks delivered on device interrogation.  No actual report available for review despite asking multiple ER provider and staff.  Patient is very active at baseline without anginal symptoms.  He reports this pain is different than his prior pain.  His pain is reproducible with palpation on exam.  Troponin negative.  EKG with inferior lateral T wave inversion.  This is new compared to EKG of December 2021 in our system.  He has follow-up with his regular cardiologist next week.  2.  Systolic heart failure s/p BiV ICD -EF improved to  50 to 55% by last echocardiogram September 2021 after ICD placement. -No evidence of volume overload on exam -Continue carvedilol and lisinopril -Continue home Lasix  3.  CAD s/p remote stenting x2 -As above -Continue aspirin, statin and beta-blocker  4.  Elevated D-dimer -No pleuritic symptoms -Per attending team  5. DM - Per primary team   6. HLD - No results found for requested labs within last 8760 hours.  - Continue statin   Dr. Cristal Deer to see.   Risk Assessment/Risk Scores:   HEAR Score (for undifferentiated chest pain):  HEAR Score: 5  New York Heart Association (NYHA) Functional Class NYHA Class I       For questions or updates, please contact [facility] Please consult [email or web address] for contact info under    Lorelei Pont, PA  07/02/2021 3:10 PM

## 2021-07-02 NOTE — H&P (Addendum)
History and Physical    Timothy Robertson MBW:[id number] DOB: 1946-01-10 DOA: 07/02/2021  Referring MD/NP/PA: Shauna Hugh, MD PCP: Dolan Amen, MD  Patient coming from: Home via EMS  Chief Complaint: Felt like my defibrillator went off  I have personally briefly reviewed patient's old medical records in [facility]   HPI: Timothy Robertson is a 75 y.o. male with medical history significant of peripheral artery disease with femoropopliteal bypass, CAD with 2 stents in the remote past, migraines, hyperlipidemia, diabetes mellitus type 2, hypertension, AAA, history of DVT, Symptomatic bradycardia status post ICD placement presenting with complaints of chest pain that felt like his defibrillator firing.  Patient admits that when he sleeps he tosses and turns normally and last night he was awoken out of his sleep with a sharp left-sided chest pain.  Pain resolved on its own over the next couple of minutes.  The area is tender to palpation and he thinks that he may have just rolled over his fibrillator to cause symptoms.Denied having any radiation of pain, shortness of breath, diaphoresis, leg swelling, abdominal pain, nausea, or vomiting.    He reports that his weight is stable at 187 pounds and his right leg is always been a little bit bigger than his left since he had femoral bypass.  He has a follow-up appointment with his cardiologist at the [facility] hospital next Wednesday.  ED Course: On admission to the emergency department patient was seen to be afebrile, pulse 55-63, blood pressures maintained, and all other vital signs maintained.  Labs significant for high-sensitivity troponins negative x2.  Chest x-ray noted chronic interstitial lung disease without acute abnormality.  Influenza and COVID-19 screening was negative.  Cardiology had been consulted and reported ICD was interrogated, but noted to show no signs of firing.  Patient had been given full dose aspirin and nitroglycerin  ointment placed.  Review of Systems  Constitutional:  Negative for fever and malaise/fatigue.  HENT:  Negative for ear discharge and nosebleeds.   Eyes:  Negative for photophobia and discharge.  Respiratory:  Negative for cough and wheezing.   Cardiovascular:  Positive for chest pain. Negative for leg swelling.  Gastrointestinal:  Negative for abdominal pain, nausea and vomiting.  Genitourinary:  Negative for dysuria and hematuria.  Musculoskeletal:  Negative for back pain and falls.  Skin:  Negative for rash.  Neurological:  Negative for focal weakness and loss of consciousness.  Psychiatric/Behavioral:  Negative for memory loss and substance abuse.    Past Medical History:  Diagnosis Date   Arthritis    Femoral-femoral bypass graft thrombosis, left (HCC)    Fibromyalgia    HOH (hard of hearing)    Pancreatitis     Past Surgical History:  Procedure Laterality Date   AORTO-FEMORAL BYPASS GRAFT     CHOLECYSTECTOMY     CORONARY ANGIOPLASTY WITH STENT PLACEMENT       reports that he quit smoking about 27 years ago. His smoking use included cigarettes. He has a 40.00 pack-year smoking history. He has never used smokeless tobacco. He reports that he does not currently use alcohol. He reports that he does not currently use drugs.  Allergies  Allergen Reactions   Penicillins     Has patient had a PCN reaction causing immediate rash, facial/tongue/throat swelling, SOB or lightheadedness with hypotension: Y Has patient had a PCN reaction causing severe rash involving mucus membranes or skin necrosis: Y Has patient had a PCN reaction that required hospitalization: Y Has patient had a PCN  reaction occurring within the last 10 years: N If all of the above answers are "NO", then may proceed with Cephalosporin use.    Sulfa Antibiotics Itching   Latex Rash    Family History  Problem Relation Age of Onset   Cancer Mother    Stroke Father     Prior to Admission medications    Medication Sig Start Date End Date Taking? Authorizing Provider  aspirin EC 81 MG tablet Take 81 mg by mouth daily.    [provider]  atorvastatin (LIPITOR) 80 MG tablet Take 80 mg by mouth daily.    [provider]  butalbital-acetaminophen-caffeine (FIORICET) 50-325-40 MG tablet Take 1 tablet by mouth 3 (three) times daily as needed for headache.    [provider]  Calcium Carb-Cholecalciferol (CALCIUM-VITAMIN D) 600-400 MG-UNIT TABS Take 1 tablet by mouth daily.    [provider]  carvedilol (COREG) 6.25 MG tablet Take 1 tablet (6.25 mg total) by mouth 2 (two) times daily with a meal. 07/14/18   Munley, Iline Oven, MD  cetirizine (ZYRTEC) 10 MG tablet Take 10 mg by mouth daily as needed for allergies.    [provider]  furosemide (LASIX) 20 MG tablet Take 20 mg by mouth daily.     [provider]  glipiZIDE (GLUCOTROL) 10 MG tablet Take 10 mg by mouth daily before breakfast.    [provider]  lisinopril (ZESTRIL) 40 MG tablet Take 40 mg by mouth daily.    [provider]  metFORMIN (GLUCOPHAGE) 1000 MG tablet Take 500 mg by mouth 2 (two) times daily with a meal.     [provider]  Multiple Vitamin (MULTIVITAMIN WITH MINERALS) TABS tablet Take 1 tablet by mouth daily. Centrum    [provider]  Omega-3 Fatty Acids (FISH OIL) 1000 MG CAPS Take 1,000 mg by mouth daily.     [provider]  ondansetron (ZOFRAN) 4 MG tablet Take 1 tablet (4 mg total) by mouth every 8 (eight) hours as needed for up to 15 doses for nausea or vomiting. 08/28/20   Curatolo, Adam, DO  potassium chloride (K-DUR) 10 MEQ tablet Take 10 mEq by mouth daily.    [provider]  topiramate (TOPAMAX) 25 MG tablet Take 1 tablet by mouth 2 (two) times daily.     [provider]    Physical Exam:  Constitutional: Elderly male currently in no acute distress Vitals:   07/02/21 0530 07/02/21 0600 07/02/21  0630 07/02/21 0734  BP: (!) 145/66 (!) 143/64 (!) 147/66 140/66  Pulse: (!) 58 (!) 55 (!) 59 60  Resp: 14 11 15 17   Temp:    97.8 F (36.6 C)  TempSrc:    Oral  SpO2: 100% 97% 100% 100%   Eyes: PERRL, lids and conjunctivae normal ENMT: Mucous membranes are moist. Posterior pharynx clear of any exudate or lesions.  Neck: normal, supple, no masses, no thyromegaly.  No JVD. Respiratory: clear to auscultation bilaterally, no wheezing, no crackles. Normal respiratory effort. No accessory muscle use.  Cardiovascular: Bradycardic, no murmurs / rubs / gallops. No extremity edema, but right leg appears slightly larger than the left. 1+ pedal pulses.   Left upper chest wall AICD with tenderness to palpation. Abdomen: no tenderness, no masses palpated. No hepatosplenomegaly. Bowel sounds positive.  Musculoskeletal: no clubbing / cyanosis. No joint deformity upper and lower extremities. Good ROM, no contractures. Normal muscle tone.  Skin: no rashes, lesions, ulcers. No induration Neurologic: CN 2-12  grossly intact. Sensation intact, DTR normal. Strength 5/5 in all 4.  Psychiatric: Normal judgment and insight. Alert and oriented x 3. Normal mood.     Labs on Admission: I have personally reviewed following labs and imaging studies  CBC: Recent Labs  Lab 07/02/21 0455 07/02/21 0508  WBC 8.4  --   NEUTROABS 4.1  --   HGB 15.2 15.3  HCT 46.5 45.0  MCV 93.0  --   PLT 189  --    Basic Metabolic Panel: Recent Labs  Lab 07/02/21 0508  NA 142  K 4.0  CL 109  GLUCOSE 111*  BUN 28*  CREATININE 1.00   GFR: CrCl cannot be calculated (Unknown ideal weight.). Liver Function Tests: No results for input(s): AST, ALT, ALKPHOS, BILITOT, PROT, ALBUMIN in the last 168 hours. No results for input(s): LIPASE, AMYLASE in the last 168 hours. No results for input(s): AMMONIA in the last 168 hours. Coagulation Profile: No results for input(s): INR, PROTIME in the last 168 hours. Cardiac Enzymes: No  results for input(s): CKTOTAL, CKMB, CKMBINDEX, TROPONINI in the last 168 hours. BNP (last 3 results) No results for input(s): PROBNP in the last 8760 hours. HbA1C: No results for input(s): HGBA1C in the last 72 hours. CBG: No results for input(s): GLUCAP in the last 168 hours. Lipid Profile: No results for input(s): CHOL, HDL, LDLCALC, TRIG, CHOLHDL, LDLDIRECT in the last 72 hours. Thyroid Function Tests: No results for input(s): TSH, T4TOTAL, FREET4, T3FREE, THYROIDAB in the last 72 hours. Anemia Panel: No results for input(s): VITAMINB12, FOLATE, FERRITIN, TIBC, IRON, RETICCTPCT in the last 72 hours. Urine analysis: No results found for: COLORURINE, APPEARANCEUR, LABSPEC, PHURINE, GLUCOSEU, HGBUR, BILIRUBINUR, KETONESUR, PROTEINUR, UROBILINOGEN, NITRITE, LEUKOCYTESUR Sepsis Labs: Recent Results (from the past 240 hour(s))  Resp Panel by RT-PCR (Flu A&B, Covid) Nasopharyngeal Swab     Status: None   Collection Time: 07/02/21  5:17 AM   Specimen: Nasopharyngeal Swab; Nasopharyngeal(NP) swabs in vial transport medium  Result Value Ref Range Status   SARS Coronavirus 2 by RT PCR NEGATIVE NEGATIVE Final    Comment: (NOTE) SARS-CoV-2 target nucleic acids are NOT DETECTED.  The SARS-CoV-2 RNA is generally detectable in upper respiratory specimens during the acute phase of infection. The lowest concentration of SARS-CoV-2 viral copies this assay can detect is 138 copies/mL. A negative result does not preclude SARS-Cov-2 infection and should not be used as the sole basis for treatment or other patient management decisions. A negative result may occur with  improper specimen collection/handling, submission of specimen other than nasopharyngeal swab, presence of viral mutation(s) within the areas targeted by this assay, and inadequate number of viral copies(<138 copies/mL). A negative result must be combined with clinical observations, patient history, and epidemiological information. The  expected result is Negative.  Fact Sheet for Patients:  BloggerCourse.com  Fact Sheet for Healthcare Providers:  SeriousBroker.it  This test is no t yet approved or cleared by the [place] FDA and  has been authorized for detection and/or diagnosis of SARS-CoV-2 by FDA under an Emergency Use Authorization (EUA). This EUA will remain  in effect (meaning this test can be used) for the duration of the COVID-19 declaration under Section 564(b)(1) of the Act, 21 U.S.C.section 360bbb-3(b)(1), unless the authorization is terminated  or revoked sooner.       Influenza A by PCR NEGATIVE NEGATIVE Final   Influenza B by PCR NEGATIVE NEGATIVE Final    Comment: (NOTE) The Xpert Xpress SARS-CoV-2/FLU/RSV plus assay is intended as  an aid in the diagnosis of influenza from Nasopharyngeal swab specimens and should not be used as a sole basis for treatment. Nasal washings and aspirates are unacceptable for Xpert Xpress SARS-CoV-2/FLU/RSV testing.  Fact Sheet for Patients: BloggerCourse.com  Fact Sheet for Healthcare Providers: SeriousBroker.it  This test is not yet approved or cleared by the [place] FDA and has been authorized for detection and/or diagnosis of SARS-CoV-2 by FDA under an Emergency Use Authorization (EUA). This EUA will remain in effect (meaning this test can be used) for the duration of the COVID-19 declaration under Section 564(b)(1) of the Act, 21 U.S.C. section 360bbb-3(b)(1), unless the authorization is terminated or revoked.  Performed at [facility], [address] [place], [place] [address]      Radiological Exams on Admission: DG Chest Portable 1 View  Result Date: 07/02/2021 CLINICAL DATA:  Reason for exam: pain, etc Patient was laying in bed and Defib went off. Patient denies and current CP or SOB. But patient also states that over the last  week he has noticed a change in his breathing EXAM: PORTABLE CHEST - 1 VIEW COMPARISON:  01/29/2021 FINDINGS: Stable left subclavian AICD. Coarse prominent interstitial markings particularly in the perihilar regions and lung bases, left greater than right, stable. No new infiltrate or overt edema. Heart size and mediastinal contours are within normal limits. Aortic Atherosclerosis (ICD10-170.0). No effusion.  No pneumothorax. Visualized bones unremarkable. IMPRESSION: Chronic interstitial lung disease without acute abnormality. Electronically Signed   By: Corlis Leak M.D.   On: 07/02/2021 05:13    EKG: Independently reviewed. Sinus rhythm at 60 bpm with ischemia noted inferiorly and laterally  Assessment/Plan Chest pain: Patient presents with complaints of feeling as though his defibrillator fired.  However, on physical exam patient with tenderness palpation over generator site of his ICD. Marland Kitchen  Patient had been given full dose aspirin in the ED. Suspect symptoms likely musculoskeletal in nature given negative cardiac enzymes and resolution of pain without treatment -Admit to a cardiac telemetry bed -Check BMP and D-dimer -Echocardiogram initially ordered, but subsequently discontinued -Appreciate cardiology consultative services, will follow-up for any further recommendations  CAD: s/p remote stenting.  High-sensitivity troponins negative x2. -Continue aspirin, statin, beta-blocker   HFrEF s/p biventricular ICD: Chronic.  Patient reports weight is stable at 187 pounds with no lower extremity swelling or signs of JVD on physical exam.  Appears to be euvolemic at this time.  Last EF noted to be 30-35% with grade 2 diastolic dysfunction back in 06/2018, but had been 50-55% the previous month.  Stress testing performed in 07/2018 was reported to be normal or stable result for which cardiac cath was not recommended at that time. -Check BNP  Elevated D-dimer: Acute D-dimer was noted to be elevated at 1.74,  but patient without any other significant signs or symptoms to give [place] concern for DVT or PE. -Follow-up with outpatient provider  Diabetes mellitus type 2: On admission glucose 111.  Diabetes appears to be relatively well controlled.  Home medications include metformin and glipizide. -Hypoglycemic protocol -Hold metformin and glipizide -CBGs before every meal and at bedtime  Peripheral vascular disease: Patient status post bypass grafting. -Continue aspirin and statin  Migraine headaches -Continue topiramate  Hyperlipidemia -Continue atorvastatin  AAA: Patient has a abdominal aortic aneurysm measured to be 3.5 cm during hospitalization at [facility] in 01/2021. -Recommended repeat ultrasound imaging in 2 years  DVT prophylaxis: Lovenox Code Status: Full Disposition Plan: Likely to discharge home if work-up  Consults called: Cardiology Admission status: Observation  Clydie Braun MD [facility]   If 7PM-7AM, please contact night-coverage   07/02/2021, 8:35 AM

## 2021-07-02 NOTE — Discharge Instructions (Signed)
We recommend you to follow-up with your cardiologist at the [facility] hospital on Wednesday as previously scheduled.

## 2022-05-24 DIAGNOSIS — Z87891 Personal history of nicotine dependence: Secondary | ICD-10-CM | POA: Diagnosis not present

## 2022-05-24 DIAGNOSIS — I259 Chronic ischemic heart disease, unspecified: Secondary | ICD-10-CM | POA: Diagnosis not present

## 2022-05-24 DIAGNOSIS — Z743 Need for continuous supervision: Secondary | ICD-10-CM | POA: Diagnosis not present

## 2022-05-24 DIAGNOSIS — K859 Acute pancreatitis without necrosis or infection, unspecified: Secondary | ICD-10-CM | POA: Diagnosis not present

## 2022-05-24 DIAGNOSIS — R110 Nausea: Secondary | ICD-10-CM | POA: Diagnosis not present

## 2022-05-24 DIAGNOSIS — K5730 Diverticulosis of large intestine without perforation or abscess without bleeding: Secondary | ICD-10-CM | POA: Diagnosis not present

## 2022-05-24 DIAGNOSIS — I10 Essential (primary) hypertension: Secondary | ICD-10-CM | POA: Diagnosis not present

## 2022-05-24 DIAGNOSIS — E119 Type 2 diabetes mellitus without complications: Secondary | ICD-10-CM | POA: Diagnosis not present

## 2022-05-24 DIAGNOSIS — R1013 Epigastric pain: Secondary | ICD-10-CM | POA: Diagnosis not present

## 2022-05-24 DIAGNOSIS — N3289 Other specified disorders of bladder: Secondary | ICD-10-CM | POA: Diagnosis not present

## 2022-05-24 DIAGNOSIS — Z86718 Personal history of other venous thrombosis and embolism: Secondary | ICD-10-CM | POA: Diagnosis not present

## 2022-05-24 DIAGNOSIS — R109 Unspecified abdominal pain: Secondary | ICD-10-CM | POA: Diagnosis not present

## 2022-05-24 DIAGNOSIS — I714 Abdominal aortic aneurysm, without rupture, unspecified: Secondary | ICD-10-CM | POA: Diagnosis not present

## 2022-05-24 DIAGNOSIS — J849 Interstitial pulmonary disease, unspecified: Secondary | ICD-10-CM | POA: Diagnosis not present

## 2022-06-21 DIAGNOSIS — Z6828 Body mass index (BMI) 28.0-28.9, adult: Secondary | ICD-10-CM | POA: Diagnosis not present

## 2022-06-21 DIAGNOSIS — I509 Heart failure, unspecified: Secondary | ICD-10-CM | POA: Diagnosis not present

## 2022-06-21 DIAGNOSIS — Z299 Encounter for prophylactic measures, unspecified: Secondary | ICD-10-CM | POA: Diagnosis not present

## 2022-06-21 DIAGNOSIS — E1165 Type 2 diabetes mellitus with hyperglycemia: Secondary | ICD-10-CM | POA: Diagnosis not present

## 2022-06-21 DIAGNOSIS — I10 Essential (primary) hypertension: Secondary | ICD-10-CM | POA: Diagnosis not present

## 2022-06-21 DIAGNOSIS — I25119 Atherosclerotic heart disease of native coronary artery with unspecified angina pectoris: Secondary | ICD-10-CM | POA: Diagnosis not present

## 2022-07-01 DIAGNOSIS — R0981 Nasal congestion: Secondary | ICD-10-CM | POA: Diagnosis not present

## 2022-07-01 DIAGNOSIS — J069 Acute upper respiratory infection, unspecified: Secondary | ICD-10-CM | POA: Diagnosis not present

## 2022-07-01 DIAGNOSIS — R059 Cough, unspecified: Secondary | ICD-10-CM | POA: Diagnosis not present

## 2022-07-01 DIAGNOSIS — Z299 Encounter for prophylactic measures, unspecified: Secondary | ICD-10-CM | POA: Diagnosis not present

## 2022-08-10 ENCOUNTER — Encounter: Payer: Self-pay | Admitting: Orthopedic Surgery

## 2022-08-19 ENCOUNTER — Encounter (HOSPITAL_COMMUNITY): Payer: Self-pay | Admitting: Orthopedic Surgery

## 2022-08-20 ENCOUNTER — Other Ambulatory Visit (HOSPITAL_COMMUNITY): Payer: Self-pay | Admitting: Orthopedic Surgery

## 2022-08-20 DIAGNOSIS — M25511 Pain in right shoulder: Secondary | ICD-10-CM

## 2022-08-26 ENCOUNTER — Other Ambulatory Visit (HOSPITAL_COMMUNITY): Payer: Self-pay | Admitting: Orthopedic Surgery

## 2022-08-26 ENCOUNTER — Ambulatory Visit (HOSPITAL_COMMUNITY)
Admission: RE | Admit: 2022-08-26 | Discharge: 2022-08-26 | Disposition: A | Discharge status: Home / Self Care | Payer: No Typology Code available for payment source | Source: Ambulatory Visit | Attending: Orthopedic Surgery | Admitting: Orthopedic Surgery

## 2022-08-26 ENCOUNTER — Encounter (HOSPITAL_COMMUNITY): Payer: Self-pay

## 2022-08-26 DIAGNOSIS — M25512 Pain in left shoulder: Secondary | ICD-10-CM

## 2022-08-26 DIAGNOSIS — M25511 Pain in right shoulder: Secondary | ICD-10-CM

## 2022-08-26 MED ORDER — IOHEXOL 180 MG/ML  SOLN
INTRAMUSCULAR | Status: AC
  Filled 2022-08-26: qty 20

## 2022-08-26 MED ORDER — POVIDONE-IODINE 10 % EX SOLN
CUTANEOUS | Status: AC
  Filled 2022-08-26: qty 14.8

## 2022-08-26 MED ORDER — LIDOCAINE HCL (PF) 1 % IJ SOLN
INTRAMUSCULAR | Status: AC
  Filled 2022-08-26: qty 5

## 2022-08-26 MED ORDER — SODIUM CHLORIDE (PF) 0.9 % IJ SOLN
INTRAMUSCULAR | Status: AC
  Filled 2022-08-26: qty 10

## 2022-08-26 MED ORDER — BUPIVACAINE HCL (PF) 0.5 % IJ SOLN
INTRAMUSCULAR | Status: AC
  Filled 2022-08-26: qty 30

## 2022-08-26 MED ORDER — METHYLPREDNISOLONE ACETATE 40 MG/ML IJ SUSP
INTRAMUSCULAR | Status: AC
  Filled 2022-08-26: qty 1

## 2022-08-27 ENCOUNTER — Ambulatory Visit (HOSPITAL_COMMUNITY)
Admission: RE | Admit: 2022-08-27 | Discharge: 2022-08-27 | Disposition: A | Discharge status: Home / Self Care | Payer: No Typology Code available for payment source | Source: Ambulatory Visit | Attending: Orthopedic Surgery | Admitting: Orthopedic Surgery

## 2022-08-27 ENCOUNTER — Other Ambulatory Visit (HOSPITAL_COMMUNITY): Payer: Self-pay | Admitting: Orthopedic Surgery

## 2022-08-27 DIAGNOSIS — M19012 Primary osteoarthritis, left shoulder: Secondary | ICD-10-CM | POA: Insufficient documentation

## 2022-08-27 DIAGNOSIS — M25512 Pain in left shoulder: Secondary | ICD-10-CM | POA: Insufficient documentation

## 2022-08-27 DIAGNOSIS — M67814 Other specified disorders of tendon, left shoulder: Secondary | ICD-10-CM | POA: Insufficient documentation

## 2022-08-27 MED ORDER — SODIUM CHLORIDE (PF) 0.9 % IJ SOLN
INTRAMUSCULAR | Status: AC
  Filled 2022-08-27: qty 10

## 2022-08-27 MED ORDER — IOHEXOL 180 MG/ML  SOLN
INTRAMUSCULAR | Status: AC
  Administered 2022-08-27: 15 mL
  Filled 2022-08-27: qty 30

## 2022-08-27 MED ORDER — IOHEXOL 180 MG/ML  SOLN
INTRAMUSCULAR | Status: AC
  Filled 2022-08-27: qty 10

## 2022-08-27 MED ORDER — SODIUM BICARBONATE 4.2 % IV SOLN
INTRAVENOUS | Status: AC
  Filled 2022-08-27: qty 10

## 2022-08-27 MED ORDER — LIDOCAINE HCL (PF) 1 % IJ SOLN
INTRAMUSCULAR | Status: AC
  Administered 2022-08-27: 5 mL
  Filled 2022-08-27: qty 5

## 2022-08-27 NOTE — Procedures (Signed)
Preprocedure Dx: LEFT shoulder pain and popping Postprocedure Dx: LEFT shoulder pain and popping Procedure  Fluoroscopically guided LEFT shoulder joint injection for CT arthrography Radiologist:  Tyron Russell Anesthesia:  2 ml of 1% lidocaine Injectate:  10 of standard solution for CT arthrography Fluoro time:  1 minutes 24 seconds EBL:   None Complications:  None

## 2022-09-13 DIAGNOSIS — E11649 Type 2 diabetes mellitus with hypoglycemia without coma: Secondary | ICD-10-CM | POA: Diagnosis not present

## 2022-09-13 DIAGNOSIS — J439 Emphysema, unspecified: Secondary | ICD-10-CM | POA: Diagnosis not present

## 2022-09-13 DIAGNOSIS — M4802 Spinal stenosis, cervical region: Secondary | ICD-10-CM | POA: Diagnosis not present

## 2022-09-13 DIAGNOSIS — I6523 Occlusion and stenosis of bilateral carotid arteries: Secondary | ICD-10-CM | POA: Diagnosis not present

## 2022-09-13 DIAGNOSIS — Z7982 Long term (current) use of aspirin: Secondary | ICD-10-CM | POA: Diagnosis not present

## 2022-09-13 DIAGNOSIS — Z7901 Long term (current) use of anticoagulants: Secondary | ICD-10-CM | POA: Diagnosis not present

## 2022-09-13 DIAGNOSIS — Z880 Allergy status to penicillin: Secondary | ICD-10-CM | POA: Diagnosis not present

## 2022-09-13 DIAGNOSIS — I110 Hypertensive heart disease with heart failure: Secondary | ICD-10-CM | POA: Diagnosis not present

## 2022-09-13 DIAGNOSIS — I714 Abdominal aortic aneurysm, without rupture, unspecified: Secondary | ICD-10-CM | POA: Diagnosis not present

## 2022-09-13 DIAGNOSIS — Z7984 Long term (current) use of oral hypoglycemic drugs: Secondary | ICD-10-CM | POA: Diagnosis not present

## 2022-09-13 DIAGNOSIS — I6622 Occlusion and stenosis of left posterior cerebral artery: Secondary | ICD-10-CM | POA: Diagnosis not present

## 2022-09-13 DIAGNOSIS — R9082 White matter disease, unspecified: Secondary | ICD-10-CM | POA: Diagnosis not present

## 2022-09-13 DIAGNOSIS — R4781 Slurred speech: Secondary | ICD-10-CM | POA: Diagnosis not present

## 2022-09-13 DIAGNOSIS — Z7902 Long term (current) use of antithrombotics/antiplatelets: Secondary | ICD-10-CM | POA: Diagnosis not present

## 2022-09-13 DIAGNOSIS — G319 Degenerative disease of nervous system, unspecified: Secondary | ICD-10-CM | POA: Diagnosis not present

## 2022-09-13 DIAGNOSIS — I5022 Chronic systolic (congestive) heart failure: Secondary | ICD-10-CM | POA: Diagnosis not present

## 2022-09-13 DIAGNOSIS — I259 Chronic ischemic heart disease, unspecified: Secondary | ICD-10-CM | POA: Diagnosis not present

## 2022-09-13 DIAGNOSIS — R479 Unspecified speech disturbances: Secondary | ICD-10-CM | POA: Diagnosis not present

## 2022-09-13 DIAGNOSIS — G8191 Hemiplegia, unspecified affecting right dominant side: Secondary | ICD-10-CM | POA: Diagnosis not present

## 2022-09-13 DIAGNOSIS — R6889 Other general symptoms and signs: Secondary | ICD-10-CM | POA: Diagnosis not present

## 2022-09-13 DIAGNOSIS — Z86718 Personal history of other venous thrombosis and embolism: Secondary | ICD-10-CM | POA: Diagnosis not present

## 2022-09-13 DIAGNOSIS — Z95810 Presence of automatic (implantable) cardiac defibrillator: Secondary | ICD-10-CM | POA: Diagnosis not present

## 2022-09-13 DIAGNOSIS — I700 Atherosclerosis of aorta: Secondary | ICD-10-CM | POA: Diagnosis not present

## 2022-09-13 DIAGNOSIS — G459 Transient cerebral ischemic attack, unspecified: Secondary | ICD-10-CM | POA: Diagnosis not present

## 2022-09-13 DIAGNOSIS — I081 Rheumatic disorders of both mitral and tricuspid valves: Secondary | ICD-10-CM | POA: Diagnosis not present

## 2022-09-13 DIAGNOSIS — R531 Weakness: Secondary | ICD-10-CM | POA: Diagnosis not present

## 2022-09-13 DIAGNOSIS — I69351 Hemiplegia and hemiparesis following cerebral infarction affecting right dominant side: Secondary | ICD-10-CM | POA: Diagnosis not present

## 2022-09-13 DIAGNOSIS — Z743 Need for continuous supervision: Secondary | ICD-10-CM | POA: Diagnosis not present

## 2022-09-13 DIAGNOSIS — Z79899 Other long term (current) drug therapy: Secondary | ICD-10-CM | POA: Diagnosis not present

## 2022-09-13 DIAGNOSIS — I3481 Nonrheumatic mitral (valve) annulus calcification: Secondary | ICD-10-CM | POA: Diagnosis not present

## 2022-09-13 DIAGNOSIS — E119 Type 2 diabetes mellitus without complications: Secondary | ICD-10-CM | POA: Diagnosis not present

## 2022-09-13 DIAGNOSIS — M47812 Spondylosis without myelopathy or radiculopathy, cervical region: Secondary | ICD-10-CM | POA: Diagnosis not present

## 2022-09-13 DIAGNOSIS — Z882 Allergy status to sulfonamides status: Secondary | ICD-10-CM | POA: Diagnosis not present

## 2022-09-14 DIAGNOSIS — R479 Unspecified speech disturbances: Secondary | ICD-10-CM | POA: Diagnosis not present

## 2022-09-14 DIAGNOSIS — G459 Transient cerebral ischemic attack, unspecified: Secondary | ICD-10-CM | POA: Diagnosis not present

## 2022-09-14 DIAGNOSIS — I081 Rheumatic disorders of both mitral and tricuspid valves: Secondary | ICD-10-CM | POA: Diagnosis not present

## 2022-09-14 DIAGNOSIS — I69351 Hemiplegia and hemiparesis following cerebral infarction affecting right dominant side: Secondary | ICD-10-CM | POA: Diagnosis not present

## 2022-09-23 DIAGNOSIS — G459 Transient cerebral ischemic attack, unspecified: Secondary | ICD-10-CM | POA: Diagnosis not present

## 2022-09-23 DIAGNOSIS — I10 Essential (primary) hypertension: Secondary | ICD-10-CM | POA: Diagnosis not present

## 2022-09-23 DIAGNOSIS — R413 Other amnesia: Secondary | ICD-10-CM | POA: Diagnosis not present

## 2022-09-23 DIAGNOSIS — Z299 Encounter for prophylactic measures, unspecified: Secondary | ICD-10-CM | POA: Diagnosis not present

## 2022-09-23 DIAGNOSIS — E1165 Type 2 diabetes mellitus with hyperglycemia: Secondary | ICD-10-CM | POA: Diagnosis not present

## 2022-10-13 DIAGNOSIS — R413 Other amnesia: Secondary | ICD-10-CM | POA: Diagnosis not present

## 2022-10-21 ENCOUNTER — Other Ambulatory Visit (HOSPITAL_COMMUNITY): Payer: Self-pay | Admitting: Orthopedic Surgery

## 2022-10-21 DIAGNOSIS — M25511 Pain in right shoulder: Secondary | ICD-10-CM

## 2022-10-27 DIAGNOSIS — R413 Other amnesia: Secondary | ICD-10-CM | POA: Diagnosis not present

## 2022-10-27 DIAGNOSIS — I10 Essential (primary) hypertension: Secondary | ICD-10-CM | POA: Diagnosis not present

## 2022-10-27 DIAGNOSIS — Z299 Encounter for prophylactic measures, unspecified: Secondary | ICD-10-CM | POA: Diagnosis not present

## 2022-10-27 DIAGNOSIS — E1165 Type 2 diabetes mellitus with hyperglycemia: Secondary | ICD-10-CM | POA: Diagnosis not present

## 2022-10-29 DIAGNOSIS — E7800 Pure hypercholesterolemia, unspecified: Secondary | ICD-10-CM | POA: Diagnosis not present

## 2022-10-29 DIAGNOSIS — Z87891 Personal history of nicotine dependence: Secondary | ICD-10-CM | POA: Diagnosis not present

## 2022-10-29 DIAGNOSIS — I509 Heart failure, unspecified: Secondary | ICD-10-CM | POA: Diagnosis not present

## 2022-10-29 DIAGNOSIS — Z299 Encounter for prophylactic measures, unspecified: Secondary | ICD-10-CM | POA: Diagnosis not present

## 2022-10-29 DIAGNOSIS — I10 Essential (primary) hypertension: Secondary | ICD-10-CM | POA: Diagnosis not present

## 2022-11-05 DIAGNOSIS — I5022 Chronic systolic (congestive) heart failure: Secondary | ICD-10-CM | POA: Diagnosis not present

## 2022-11-05 DIAGNOSIS — E1165 Type 2 diabetes mellitus with hyperglycemia: Secondary | ICD-10-CM | POA: Diagnosis not present

## 2022-11-05 DIAGNOSIS — R197 Diarrhea, unspecified: Secondary | ICD-10-CM | POA: Diagnosis not present

## 2022-11-05 DIAGNOSIS — G459 Transient cerebral ischemic attack, unspecified: Secondary | ICD-10-CM | POA: Diagnosis not present

## 2022-11-05 DIAGNOSIS — Z299 Encounter for prophylactic measures, unspecified: Secondary | ICD-10-CM | POA: Diagnosis not present

## 2022-11-16 ENCOUNTER — Other Ambulatory Visit (HOSPITAL_COMMUNITY): Payer: No Typology Code available for payment source

## 2022-11-23 DIAGNOSIS — I10 Essential (primary) hypertension: Secondary | ICD-10-CM | POA: Diagnosis not present

## 2022-11-23 DIAGNOSIS — M1712 Unilateral primary osteoarthritis, left knee: Secondary | ICD-10-CM | POA: Diagnosis not present

## 2022-11-23 DIAGNOSIS — Z299 Encounter for prophylactic measures, unspecified: Secondary | ICD-10-CM | POA: Diagnosis not present

## 2022-11-25 DIAGNOSIS — I10 Essential (primary) hypertension: Secondary | ICD-10-CM | POA: Diagnosis not present

## 2022-11-25 DIAGNOSIS — I5022 Chronic systolic (congestive) heart failure: Secondary | ICD-10-CM | POA: Diagnosis not present

## 2022-11-25 DIAGNOSIS — Z299 Encounter for prophylactic measures, unspecified: Secondary | ICD-10-CM | POA: Diagnosis not present

## 2022-12-06 ENCOUNTER — Ambulatory Visit (HOSPITAL_COMMUNITY)
Admission: RE | Admit: 2022-12-06 | Discharge: 2022-12-06 | Disposition: A | Discharge status: Home / Self Care | Payer: No Typology Code available for payment source | Source: Ambulatory Visit | Attending: Orthopedic Surgery | Admitting: Orthopedic Surgery

## 2022-12-06 ENCOUNTER — Encounter (HOSPITAL_COMMUNITY): Payer: Self-pay

## 2022-12-06 DIAGNOSIS — M25511 Pain in right shoulder: Secondary | ICD-10-CM | POA: Diagnosis present

## 2022-12-06 MED ORDER — METHYLPREDNISOLONE ACETATE 40 MG/ML IJ SUSP
INTRAMUSCULAR | Status: AC
  Administered 2022-12-06: 40 mg via INTRA_ARTICULAR
  Filled 2022-12-06: qty 1

## 2022-12-06 MED ORDER — METHYLPREDNISOLONE ACETATE 40 MG/ML IJ SUSP
INTRAMUSCULAR | Status: AC
  Filled 2022-12-06: qty 1

## 2022-12-06 MED ORDER — BUPIVACAINE HCL (PF) 0.5 % IJ SOLN
INTRAMUSCULAR | Status: AC
  Administered 2022-12-06: 3 mL via INTRA_ARTICULAR
  Filled 2022-12-06: qty 30

## 2022-12-06 MED ORDER — LIDOCAINE HCL (PF) 1 % IJ SOLN
INTRAMUSCULAR | Status: AC
  Administered 2022-12-06: 2 mL
  Filled 2022-12-06: qty 5

## 2022-12-06 NOTE — Progress Notes (Signed)
Shoulder injection complete no signs of distress.

## 2022-12-27 DIAGNOSIS — R5383 Other fatigue: Secondary | ICD-10-CM | POA: Diagnosis not present

## 2022-12-27 DIAGNOSIS — J849 Interstitial pulmonary disease, unspecified: Secondary | ICD-10-CM | POA: Diagnosis not present

## 2022-12-27 DIAGNOSIS — D692 Other nonthrombocytopenic purpura: Secondary | ICD-10-CM | POA: Diagnosis not present

## 2022-12-27 DIAGNOSIS — Z0000 Encounter for general adult medical examination without abnormal findings: Secondary | ICD-10-CM | POA: Diagnosis not present

## 2022-12-27 DIAGNOSIS — Z299 Encounter for prophylactic measures, unspecified: Secondary | ICD-10-CM | POA: Diagnosis not present

## 2022-12-27 DIAGNOSIS — R059 Cough, unspecified: Secondary | ICD-10-CM | POA: Diagnosis not present

## 2022-12-27 DIAGNOSIS — I10 Essential (primary) hypertension: Secondary | ICD-10-CM | POA: Diagnosis not present

## 2022-12-27 DIAGNOSIS — E7800 Pure hypercholesterolemia, unspecified: Secondary | ICD-10-CM | POA: Diagnosis not present

## 2022-12-27 DIAGNOSIS — Z79899 Other long term (current) drug therapy: Secondary | ICD-10-CM | POA: Diagnosis not present

## 2022-12-31 DIAGNOSIS — J439 Emphysema, unspecified: Secondary | ICD-10-CM | POA: Diagnosis not present

## 2022-12-31 DIAGNOSIS — R051 Acute cough: Secondary | ICD-10-CM | POA: Diagnosis not present

## 2023-01-12 DIAGNOSIS — R531 Weakness: Secondary | ICD-10-CM | POA: Diagnosis not present

## 2023-01-12 DIAGNOSIS — Z743 Need for continuous supervision: Secondary | ICD-10-CM | POA: Diagnosis not present

## 2023-01-12 DIAGNOSIS — K863 Pseudocyst of pancreas: Secondary | ICD-10-CM | POA: Diagnosis not present

## 2023-01-12 DIAGNOSIS — R5383 Other fatigue: Secondary | ICD-10-CM | POA: Diagnosis not present

## 2023-01-12 DIAGNOSIS — G4489 Other headache syndrome: Secondary | ICD-10-CM | POA: Diagnosis not present

## 2023-01-12 DIAGNOSIS — R109 Unspecified abdominal pain: Secondary | ICD-10-CM | POA: Diagnosis not present

## 2023-01-12 DIAGNOSIS — R9431 Abnormal electrocardiogram [ECG] [EKG]: Secondary | ICD-10-CM | POA: Diagnosis not present

## 2023-01-12 DIAGNOSIS — K5730 Diverticulosis of large intestine without perforation or abscess without bleeding: Secondary | ICD-10-CM | POA: Diagnosis not present

## 2023-01-12 DIAGNOSIS — I7143 Infrarenal abdominal aortic aneurysm, without rupture: Secondary | ICD-10-CM | POA: Diagnosis not present

## 2023-01-12 DIAGNOSIS — I10 Essential (primary) hypertension: Secondary | ICD-10-CM | POA: Diagnosis not present

## 2023-01-12 DIAGNOSIS — R079 Chest pain, unspecified: Secondary | ICD-10-CM | POA: Diagnosis not present

## 2023-01-12 DIAGNOSIS — Z87891 Personal history of nicotine dependence: Secondary | ICD-10-CM | POA: Diagnosis not present

## 2023-01-12 DIAGNOSIS — E119 Type 2 diabetes mellitus without complications: Secondary | ICD-10-CM | POA: Diagnosis not present

## 2023-01-12 DIAGNOSIS — R519 Headache, unspecified: Secondary | ICD-10-CM | POA: Diagnosis not present

## 2023-01-12 DIAGNOSIS — Z86718 Personal history of other venous thrombosis and embolism: Secondary | ICD-10-CM | POA: Diagnosis not present

## 2023-01-12 DIAGNOSIS — J439 Emphysema, unspecified: Secondary | ICD-10-CM | POA: Diagnosis not present

## 2023-01-12 DIAGNOSIS — R6889 Other general symptoms and signs: Secondary | ICD-10-CM | POA: Diagnosis not present

## 2023-01-31 DIAGNOSIS — E1165 Type 2 diabetes mellitus with hyperglycemia: Secondary | ICD-10-CM | POA: Diagnosis not present

## 2023-01-31 DIAGNOSIS — I10 Essential (primary) hypertension: Secondary | ICD-10-CM | POA: Diagnosis not present

## 2023-01-31 DIAGNOSIS — Z299 Encounter for prophylactic measures, unspecified: Secondary | ICD-10-CM | POA: Diagnosis not present

## 2023-02-20 DIAGNOSIS — Z86718 Personal history of other venous thrombosis and embolism: Secondary | ICD-10-CM | POA: Diagnosis not present

## 2023-02-20 DIAGNOSIS — Z95810 Presence of automatic (implantable) cardiac defibrillator: Secondary | ICD-10-CM | POA: Diagnosis not present

## 2023-02-20 DIAGNOSIS — R29898 Other symptoms and signs involving the musculoskeletal system: Secondary | ICD-10-CM | POA: Diagnosis not present

## 2023-02-20 DIAGNOSIS — E11649 Type 2 diabetes mellitus with hypoglycemia without coma: Secondary | ICD-10-CM | POA: Diagnosis not present

## 2023-02-20 DIAGNOSIS — Z7984 Long term (current) use of oral hypoglycemic drugs: Secondary | ICD-10-CM | POA: Diagnosis not present

## 2023-02-20 DIAGNOSIS — Z7982 Long term (current) use of aspirin: Secondary | ICD-10-CM | POA: Diagnosis not present

## 2023-02-20 DIAGNOSIS — E876 Hypokalemia: Secondary | ICD-10-CM | POA: Diagnosis not present

## 2023-02-20 DIAGNOSIS — I6523 Occlusion and stenosis of bilateral carotid arteries: Secondary | ICD-10-CM | POA: Diagnosis not present

## 2023-02-20 DIAGNOSIS — I5022 Chronic systolic (congestive) heart failure: Secondary | ICD-10-CM | POA: Diagnosis not present

## 2023-02-20 DIAGNOSIS — I6521 Occlusion and stenosis of right carotid artery: Secondary | ICD-10-CM | POA: Diagnosis not present

## 2023-02-20 DIAGNOSIS — Z743 Need for continuous supervision: Secondary | ICD-10-CM | POA: Diagnosis not present

## 2023-02-20 DIAGNOSIS — Z5181 Encounter for therapeutic drug level monitoring: Secondary | ICD-10-CM | POA: Diagnosis not present

## 2023-02-20 DIAGNOSIS — Z7902 Long term (current) use of antithrombotics/antiplatelets: Secondary | ICD-10-CM | POA: Diagnosis not present

## 2023-02-20 DIAGNOSIS — G459 Transient cerebral ischemic attack, unspecified: Secondary | ICD-10-CM | POA: Diagnosis not present

## 2023-02-20 DIAGNOSIS — G4489 Other headache syndrome: Secondary | ICD-10-CM | POA: Diagnosis not present

## 2023-02-20 DIAGNOSIS — R6889 Other general symptoms and signs: Secondary | ICD-10-CM | POA: Diagnosis not present

## 2023-02-20 DIAGNOSIS — I110 Hypertensive heart disease with heart failure: Secondary | ICD-10-CM | POA: Diagnosis not present

## 2023-02-20 DIAGNOSIS — I10 Essential (primary) hypertension: Secondary | ICD-10-CM | POA: Diagnosis not present

## 2023-02-20 DIAGNOSIS — Z880 Allergy status to penicillin: Secondary | ICD-10-CM | POA: Diagnosis not present

## 2023-02-20 DIAGNOSIS — R29810 Facial weakness: Secondary | ICD-10-CM | POA: Diagnosis not present

## 2023-02-20 DIAGNOSIS — E119 Type 2 diabetes mellitus without complications: Secondary | ICD-10-CM | POA: Diagnosis not present

## 2023-02-20 DIAGNOSIS — Z87891 Personal history of nicotine dependence: Secondary | ICD-10-CM | POA: Diagnosis not present

## 2023-02-20 DIAGNOSIS — R4781 Slurred speech: Secondary | ICD-10-CM | POA: Diagnosis not present

## 2023-02-20 DIAGNOSIS — R531 Weakness: Secondary | ICD-10-CM | POA: Diagnosis not present

## 2023-02-20 DIAGNOSIS — Z882 Allergy status to sulfonamides status: Secondary | ICD-10-CM | POA: Diagnosis not present

## 2023-02-20 DIAGNOSIS — E162 Hypoglycemia, unspecified: Secondary | ICD-10-CM | POA: Diagnosis not present

## 2023-02-21 DIAGNOSIS — Z95810 Presence of automatic (implantable) cardiac defibrillator: Secondary | ICD-10-CM | POA: Diagnosis not present

## 2023-02-21 DIAGNOSIS — I5022 Chronic systolic (congestive) heart failure: Secondary | ICD-10-CM | POA: Diagnosis not present

## 2023-02-21 DIAGNOSIS — Z86718 Personal history of other venous thrombosis and embolism: Secondary | ICD-10-CM | POA: Diagnosis not present

## 2023-02-21 DIAGNOSIS — I10 Essential (primary) hypertension: Secondary | ICD-10-CM | POA: Diagnosis not present

## 2023-02-21 DIAGNOSIS — E119 Type 2 diabetes mellitus without complications: Secondary | ICD-10-CM | POA: Diagnosis not present

## 2023-02-21 DIAGNOSIS — G459 Transient cerebral ischemic attack, unspecified: Secondary | ICD-10-CM | POA: Diagnosis not present

## 2023-02-24 DIAGNOSIS — I10 Essential (primary) hypertension: Secondary | ICD-10-CM | POA: Diagnosis not present

## 2023-02-24 DIAGNOSIS — I714 Abdominal aortic aneurysm, without rupture, unspecified: Secondary | ICD-10-CM | POA: Diagnosis not present

## 2023-02-24 DIAGNOSIS — E1165 Type 2 diabetes mellitus with hyperglycemia: Secondary | ICD-10-CM | POA: Diagnosis not present

## 2023-02-24 DIAGNOSIS — Z299 Encounter for prophylactic measures, unspecified: Secondary | ICD-10-CM | POA: Diagnosis not present

## 2023-02-24 DIAGNOSIS — I509 Heart failure, unspecified: Secondary | ICD-10-CM | POA: Diagnosis not present

## 2023-05-05 DIAGNOSIS — M1990 Unspecified osteoarthritis, unspecified site: Secondary | ICD-10-CM | POA: Diagnosis not present

## 2023-05-05 DIAGNOSIS — I10 Essential (primary) hypertension: Secondary | ICD-10-CM | POA: Diagnosis not present

## 2023-05-05 DIAGNOSIS — E1165 Type 2 diabetes mellitus with hyperglycemia: Secondary | ICD-10-CM | POA: Diagnosis not present

## 2023-05-05 DIAGNOSIS — I509 Heart failure, unspecified: Secondary | ICD-10-CM | POA: Diagnosis not present

## 2023-05-05 DIAGNOSIS — Z299 Encounter for prophylactic measures, unspecified: Secondary | ICD-10-CM | POA: Diagnosis not present

## 2023-05-05 DIAGNOSIS — J849 Interstitial pulmonary disease, unspecified: Secondary | ICD-10-CM | POA: Diagnosis not present

## 2023-05-05 DIAGNOSIS — I5022 Chronic systolic (congestive) heart failure: Secondary | ICD-10-CM | POA: Diagnosis not present

## 2023-05-09 DIAGNOSIS — R251 Tremor, unspecified: Secondary | ICD-10-CM | POA: Diagnosis not present

## 2023-05-09 DIAGNOSIS — E119 Type 2 diabetes mellitus without complications: Secondary | ICD-10-CM | POA: Diagnosis not present

## 2023-05-09 DIAGNOSIS — G459 Transient cerebral ischemic attack, unspecified: Secondary | ICD-10-CM | POA: Diagnosis not present

## 2023-05-09 DIAGNOSIS — R202 Paresthesia of skin: Secondary | ICD-10-CM | POA: Diagnosis not present

## 2023-05-09 DIAGNOSIS — R4182 Altered mental status, unspecified: Secondary | ICD-10-CM | POA: Diagnosis not present

## 2023-05-09 DIAGNOSIS — R29898 Other symptoms and signs involving the musculoskeletal system: Secondary | ICD-10-CM | POA: Diagnosis not present

## 2023-05-09 DIAGNOSIS — I6389 Other cerebral infarction: Secondary | ICD-10-CM | POA: Diagnosis not present

## 2023-05-09 DIAGNOSIS — I6523 Occlusion and stenosis of bilateral carotid arteries: Secondary | ICD-10-CM | POA: Diagnosis not present

## 2023-05-09 DIAGNOSIS — G250 Essential tremor: Secondary | ICD-10-CM | POA: Diagnosis not present

## 2023-05-09 DIAGNOSIS — R519 Headache, unspecified: Secondary | ICD-10-CM | POA: Diagnosis not present

## 2023-05-09 DIAGNOSIS — I700 Atherosclerosis of aorta: Secondary | ICD-10-CM | POA: Diagnosis not present

## 2023-05-09 DIAGNOSIS — R6889 Other general symptoms and signs: Secondary | ICD-10-CM | POA: Diagnosis not present

## 2023-05-09 DIAGNOSIS — R4781 Slurred speech: Secondary | ICD-10-CM | POA: Diagnosis not present

## 2023-05-09 DIAGNOSIS — R200 Anesthesia of skin: Secondary | ICD-10-CM | POA: Diagnosis not present

## 2023-05-09 DIAGNOSIS — R531 Weakness: Secondary | ICD-10-CM | POA: Diagnosis not present

## 2023-05-09 DIAGNOSIS — I10 Essential (primary) hypertension: Secondary | ICD-10-CM | POA: Diagnosis not present

## 2023-05-09 DIAGNOSIS — I5030 Unspecified diastolic (congestive) heart failure: Secondary | ICD-10-CM | POA: Diagnosis not present

## 2023-05-09 DIAGNOSIS — I110 Hypertensive heart disease with heart failure: Secondary | ICD-10-CM | POA: Diagnosis not present

## 2023-05-09 DIAGNOSIS — Z743 Need for continuous supervision: Secondary | ICD-10-CM | POA: Diagnosis not present

## 2023-05-09 DIAGNOSIS — J439 Emphysema, unspecified: Secondary | ICD-10-CM | POA: Diagnosis not present

## 2023-05-09 DIAGNOSIS — R0989 Other specified symptoms and signs involving the circulatory and respiratory systems: Secondary | ICD-10-CM | POA: Diagnosis not present

## 2023-05-09 DIAGNOSIS — J9811 Atelectasis: Secondary | ICD-10-CM | POA: Diagnosis not present

## 2023-05-10 DIAGNOSIS — I10 Essential (primary) hypertension: Secondary | ICD-10-CM | POA: Diagnosis not present

## 2023-05-10 DIAGNOSIS — E119 Type 2 diabetes mellitus without complications: Secondary | ICD-10-CM | POA: Diagnosis not present

## 2023-05-10 DIAGNOSIS — Z95810 Presence of automatic (implantable) cardiac defibrillator: Secondary | ICD-10-CM | POA: Diagnosis not present

## 2023-05-18 DIAGNOSIS — I5022 Chronic systolic (congestive) heart failure: Secondary | ICD-10-CM | POA: Diagnosis not present

## 2023-05-18 DIAGNOSIS — I10 Essential (primary) hypertension: Secondary | ICD-10-CM | POA: Diagnosis not present

## 2023-05-18 DIAGNOSIS — E1165 Type 2 diabetes mellitus with hyperglycemia: Secondary | ICD-10-CM | POA: Diagnosis not present

## 2023-05-18 DIAGNOSIS — Z299 Encounter for prophylactic measures, unspecified: Secondary | ICD-10-CM | POA: Diagnosis not present

## 2023-06-15 DIAGNOSIS — S50819A Abrasion of unspecified forearm, initial encounter: Secondary | ICD-10-CM | POA: Diagnosis not present

## 2023-06-15 DIAGNOSIS — G459 Transient cerebral ischemic attack, unspecified: Secondary | ICD-10-CM | POA: Diagnosis not present

## 2023-06-15 DIAGNOSIS — I5022 Chronic systolic (congestive) heart failure: Secondary | ICD-10-CM | POA: Diagnosis not present

## 2023-06-15 DIAGNOSIS — Z299 Encounter for prophylactic measures, unspecified: Secondary | ICD-10-CM | POA: Diagnosis not present

## 2023-06-15 DIAGNOSIS — I10 Essential (primary) hypertension: Secondary | ICD-10-CM | POA: Diagnosis not present

## 2023-07-10 DIAGNOSIS — Z6826 Body mass index (BMI) 26.0-26.9, adult: Secondary | ICD-10-CM | POA: Diagnosis not present

## 2023-07-10 DIAGNOSIS — R030 Elevated blood-pressure reading, without diagnosis of hypertension: Secondary | ICD-10-CM | POA: Diagnosis not present

## 2023-07-10 DIAGNOSIS — R059 Cough, unspecified: Secondary | ICD-10-CM | POA: Diagnosis not present

## 2023-07-10 DIAGNOSIS — R0981 Nasal congestion: Secondary | ICD-10-CM | POA: Diagnosis not present

## 2023-07-10 DIAGNOSIS — E663 Overweight: Secondary | ICD-10-CM | POA: Diagnosis not present

## 2023-08-01 ENCOUNTER — Emergency Department (HOSPITAL_COMMUNITY): Payer: No Typology Code available for payment source

## 2023-08-01 ENCOUNTER — Encounter (HOSPITAL_COMMUNITY): Payer: Self-pay | Admitting: Emergency Medicine

## 2023-08-01 ENCOUNTER — Emergency Department (HOSPITAL_COMMUNITY)
Admission: EM | Admit: 2023-08-01 | Discharge: 2023-08-01 | Disposition: A | Discharge status: Home / Self Care | Payer: No Typology Code available for payment source | Attending: Emergency Medicine | Admitting: Emergency Medicine

## 2023-08-01 ENCOUNTER — Other Ambulatory Visit: Payer: Self-pay

## 2023-08-01 DIAGNOSIS — Z1152 Encounter for screening for COVID-19: Secondary | ICD-10-CM | POA: Insufficient documentation

## 2023-08-01 DIAGNOSIS — E162 Hypoglycemia, unspecified: Secondary | ICD-10-CM

## 2023-08-01 DIAGNOSIS — Z7984 Long term (current) use of oral hypoglycemic drugs: Secondary | ICD-10-CM | POA: Insufficient documentation

## 2023-08-01 DIAGNOSIS — I959 Hypotension, unspecified: Secondary | ICD-10-CM | POA: Diagnosis not present

## 2023-08-01 DIAGNOSIS — R4182 Altered mental status, unspecified: Secondary | ICD-10-CM | POA: Diagnosis present

## 2023-08-01 DIAGNOSIS — E11649 Type 2 diabetes mellitus with hypoglycemia without coma: Secondary | ICD-10-CM | POA: Insufficient documentation

## 2023-08-01 DIAGNOSIS — E161 Other hypoglycemia: Secondary | ICD-10-CM | POA: Diagnosis not present

## 2023-08-01 LAB — COMPREHENSIVE METABOLIC PANEL
ALT: 17 U/L (ref 0–44)
AST: 22 U/L (ref 15–41)
Albumin: 3.1 g/dL — ABNORMAL LOW (ref 3.5–5.0)
Alkaline Phosphatase: 68 U/L (ref 38–126)
Anion gap: 8 (ref 5–15)
BUN: 18 mg/dL (ref 8–23)
CO2: 23 mmol/L (ref 22–32)
Calcium: 9.5 mg/dL (ref 8.9–10.3)
Chloride: 107 mmol/L (ref 98–111)
Creatinine, Ser: 0.92 mg/dL (ref 0.61–1.24)
GFR, Estimated: >60 mL/min (ref >60)
Glucose, Bld: 89 mg/dL (ref 70–99)
Potassium: 3.9 mmol/L (ref 3.5–5.1)
Sodium: 138 mmol/L (ref 135–145)
Total Bilirubin: 0.6 mg/dL (ref <1.2)
Total Protein: 6.2 g/dL — ABNORMAL LOW (ref 6.5–8.1)

## 2023-08-01 LAB — RESP PANEL BY RT-PCR (RSV, FLU A&B, COVID)  RVPGX2
Influenza A by PCR: NEGATIVE
Influenza B by PCR: NEGATIVE
Resp Syncytial Virus by PCR: NEGATIVE
SARS Coronavirus 2 by RT PCR: NEGATIVE

## 2023-08-01 LAB — CBC WITH DIFFERENTIAL/PLATELET
Abs Immature Granulocytes: 0.06 10*3/uL (ref 0.00–0.07)
Basophils Absolute: 0.1 10*3/uL (ref 0.0–0.1)
Basophils Relative: 1 %
Eosinophils Absolute: 0.7 10*3/uL — ABNORMAL HIGH (ref 0.0–0.5)
Eosinophils Relative: 7 %
HCT: 42.5 % (ref 39.0–52.0)
Hemoglobin: 13.7 g/dL (ref 13.0–17.0)
Immature Granulocytes: 1 %
Lymphocytes Relative: 24 %
Lymphs Abs: 2.4 10*3/uL (ref 0.7–4.0)
MCH: 30.7 pg (ref 26.0–34.0)
MCHC: 32.2 g/dL (ref 30.0–36.0)
MCV: 95.3 fL (ref 80.0–100.0)
Monocytes Absolute: 1.2 10*3/uL — ABNORMAL HIGH (ref 0.1–1.0)
Monocytes Relative: 12 %
Neutro Abs: 5.7 10*3/uL (ref 1.7–7.7)
Neutrophils Relative %: 55 %
Platelets: 244 10*3/uL (ref 150–400)
RBC: 4.46 MIL/uL (ref 4.22–5.81)
RDW: 14.2 % (ref 11.5–15.5)
WBC: 10 10*3/uL (ref 4.0–10.5)
nRBC: 0 % (ref 0.0–0.2)

## 2023-08-01 LAB — CBG MONITORING, ED
Glucose-Capillary: 100 mg/dL — ABNORMAL HIGH (ref 70–99)
Glucose-Capillary: 78 mg/dL (ref 70–99)

## 2023-08-01 NOTE — ED Provider Notes (Signed)
[facility] AT [facility] Provider Note   CSN: [id number] Arrival date & time: 08/01/23  1612     History Chief Complaint  Patient presents with   Hypoglycemia   Altered Mental Status    HPI Timothy Robertson is a 77 y.o. male presenting for episode of altered mentation.  77 year old male history of diabetes on glipizide as well as multiple other medications.  Wife is at bedside states that while he was driving his car today he gradually became more confused during their drive to lunch.  He took all his medications as normal prior to this event. He denies fevers chills nausea vomiting syncope shortness of breath at this time.  EMS was called out as he pulled his car slowly off to the side.  When they got there, his blood sugar was in the low 50s.  Given p.o. dextrose and improved.  Mental status back to baseline now.  History of similar taken off glipizide in the past but PCP restarted patient on glipizide recently.    Patient's recorded medical, surgical, social, medication list and allergies were reviewed in the Snapshot window as part of the initial history.   Review of Systems   Review of Systems  Constitutional:  Negative for chills and fever.  HENT:  Negative for ear pain and sore throat.   Eyes:  Negative for pain and visual disturbance.  Respiratory:  Negative for cough and shortness of breath.   Cardiovascular:  Negative for chest pain and palpitations.  Gastrointestinal:  Negative for abdominal pain and vomiting.  Genitourinary:  Negative for dysuria and hematuria.  Musculoskeletal:  Negative for arthralgias and back pain.  Skin:  Negative for color change and rash.  Neurological:  Negative for seizures and syncope.  Psychiatric/Behavioral:  Positive for confusion.   All other systems reviewed and are negative.   Physical Exam Updated Vital Signs BP (!) 141/58 (BP Location: Left Arm)   Pulse 60   Temp 97.6 F (36.4 C) (Oral)    Resp 20   Ht 5\' 8"  (1.727 m)   Wt 84.8 kg   SpO2 100%   BMI 28.43 kg/m  Physical Exam Vitals and nursing note reviewed.  Constitutional:      General: He is not in acute distress.    Appearance: He is well-developed.  HENT:     Head: Normocephalic and atraumatic.  Eyes:     Conjunctiva/sclera: Conjunctivae normal.  Cardiovascular:     Rate and Rhythm: Normal rate and regular rhythm.  Pulmonary:     Effort: Pulmonary effort is normal. No respiratory distress.  Abdominal:     General: Abdomen is flat. There is no distension.  Musculoskeletal:        General: No swelling or deformity.  Skin:    General: Skin is warm and dry.     Capillary Refill: Capillary refill takes less than 2 seconds.  Neurological:     Mental Status: He is alert and oriented to person, place, and time. Mental status is at baseline.      ED Course/ Medical Decision Making/ A&P    Procedures Procedures   Medications Ordered in ED Medications - No data to display  Medical Decision Making:    Timothy Robertson is a 77 y.o. male who presented to the ED today with episode of hypoglycemia detailed above.     Handoff received from EMS.  Additional history discussed with patient's family/caregivers.  Patient placed on continuous vitals and telemetry monitoring while  in ED which was reviewed periodically.   Complete initial physical exam performed, notably the patient  was hemodynamically stable no acute distress.      Reviewed and confirmed nursing documentation for past medical history, family history, social history.    Initial Assessment:   Patient's HPI+PE  findings seems most likely with effect from patient's glipizide.  Will evaluate for other metabolic crisis with CBC, BMP, chest x-ray, head CT to evaluate for intracranial etiology of patient's symptoms. Will reassess after 3 hours for any further hypoglycemia in the setting of likely glipizide effect. Reassessment and Plan:   On  reassessment he is manage mental status baseline for an extended amount of time. He is requesting discharge soon as possible.  Still pending his head CT unfortunately. I did not appreciate any large intracranial mass or etiology for his symptoms.  Recommended he stop the glipizide check his blood sugar 3 times daily and follow-up with his PCP within 5 days with the results of holding his glipizide.  Patient expressed agreement with the plan.  Radiology reports negative.  Patient stable for outpatient care management at this time.   Disposition:  I have considered need for hospitalization, however, considering all of the above, I believe this patient is stable for discharge at this time.  Patient/family educated about specific return precautions for given chief complaint and symptoms.  Patient/family educated about follow-up with PCP .     Patient/family expressed understanding of return precautions and need for follow-up. Patient spoken to regarding all imaging and laboratory results and appropriate follow up for these results. All education provided in verbal form with additional information in written form. Time was allowed for answering of patient questions. Patient discharged.    Emergency Department Medication Summary:   Medications - No data to display       Clinical Impression:  1. Altered mental status, unspecified altered mental status type   2. Hypoglycemia      Data Unavailable   Final Clinical Impression(s) / ED Diagnoses Final diagnoses:  Altered mental status, unspecified altered mental status type  Hypoglycemia    Rx / DC Orders ED Discharge Orders     None         Glyn Ade, MD 08/01/23 2325

## 2023-08-01 NOTE — ED Triage Notes (Signed)
Pt BIB EMS from restaurant was found A&Ox1 by EMS, wife felt he wasn't acting like himself while driving and had him pull off into parking lot, CBG 60 upon arrival hx of diabetes. Given oral glucose CBG 69 after oral glucose, then given 10g of D10 with improvement in mental status A&Ox3 upon arrival with confusion about the events that happened today. Wife reports recent evaluation for possible dementia per wife.  EMS VS:  HR 70 96% RA 146/78

## 2023-08-01 NOTE — Discharge Instructions (Addendum)
Please stop your glipizide.  Monitor your blood sugar 3 times a day, follow-up with your primary care provider in 5 days for reassessment.

## 2023-08-01 NOTE — ED Notes (Signed)
Patient transported to X-ray 

## 2023-08-04 DIAGNOSIS — I25119 Atherosclerotic heart disease of native coronary artery with unspecified angina pectoris: Secondary | ICD-10-CM | POA: Diagnosis not present

## 2023-08-04 DIAGNOSIS — I10 Essential (primary) hypertension: Secondary | ICD-10-CM | POA: Diagnosis not present

## 2023-08-04 DIAGNOSIS — D692 Other nonthrombocytopenic purpura: Secondary | ICD-10-CM | POA: Diagnosis not present

## 2023-08-04 DIAGNOSIS — R52 Pain, unspecified: Secondary | ICD-10-CM | POA: Diagnosis not present

## 2023-08-04 DIAGNOSIS — Z299 Encounter for prophylactic measures, unspecified: Secondary | ICD-10-CM | POA: Diagnosis not present

## 2023-08-04 DIAGNOSIS — E162 Hypoglycemia, unspecified: Secondary | ICD-10-CM | POA: Diagnosis not present

## 2023-08-04 DIAGNOSIS — E1165 Type 2 diabetes mellitus with hyperglycemia: Secondary | ICD-10-CM | POA: Diagnosis not present

## 2023-08-08 ENCOUNTER — Telehealth: Payer: Self-pay | Admitting: *Deleted

## 2023-08-08 NOTE — Progress Notes (Signed)
  Care Coordination   Note   08/08/2023 Name: Timothy Robertson MRN: [id number] DOB: 03-31-1946  Timothy Robertson is a 77 y.o. year old male who sees [facility] for [facility] care. I reached out to Timothy Robertson by phone today to offer care coordination services.  Timothy Robertson was given information about Care Coordination services today including:   The Care Coordination services include support from the care team which includes your Nurse Coordinator, Clinical Social Worker, or Pharmacist.  The Care Coordination team is here to help remove barriers to the health concerns and goals most important to you. Care Coordination services are voluntary, and the patient may decline or stop services at any time by request to their care team member.   Care Coordination Consent Status: Patient agreed to services and verbal consent obtained.   Follow up plan:  Telephone appointment with care coordination team member scheduled for:  11/26  Encounter Outcome:  Patient Scheduled  [facility] Coordination Care Guide  Direct Dial: [phone]

## 2023-08-09 ENCOUNTER — Encounter: Payer: Self-pay | Admitting: *Deleted

## 2023-08-09 ENCOUNTER — Ambulatory Visit: Payer: Self-pay | Admitting: *Deleted

## 2023-08-09 NOTE — Patient Instructions (Signed)
Visit Information  Thank you for taking time to visit with me today. Please don't hesitate to contact me if I can be of assistance to you.   Following are the goals we discussed today:   Goals Addressed               This Visit's Progress     EMMI- Receive Counseling & Supportive Services to Reduce & Manage Symptoms of Anger. (pt-stated)   On track     Care Coordination Interventions:  Interventions Today    Flowsheet Row Most Recent Value  Chronic Disease   Chronic disease during today's visit Diabetes, Other  [Transient Ischemic Attack, Stroke, Peripheral Vascular Disease, Coronary Artery Disease, Caregiver Burnout, Stress & Fatigue, Anger Issues, Marital Discord]  General Interventions   General Interventions Discussed/Reviewed General Interventions Discussed, Labs, Vaccines, Doctor Visits, Health Screening, Annual Foot Exam, General Interventions Reviewed, Annual Eye Exam, Durable Medical Equipment (DME), Community Resources, Level of Care, Communication with  [Communication with Care Team Members]  Labs Hgb A1c every 3 months, Kidney Function  [Encouraged]  Vaccines COVID-19, Flu, Pneumonia, RSV, Shingles, Tetanus/Pertussis/Diphtheria  [Encouraged]  Doctor Visits Discussed/Reviewed Doctor Visits Discussed, Specialist, Doctor Visits Reviewed, Annual Wellness Visits, PCP  [Encouraged Routine Engagement]  Health Screening Prostate, Colonoscopy, Bone Density  [Encouraged]  Durable Medical Equipment (DME) Other, Bed side commode, BP Cuff, Glucomoter, Environmental consultant, Wheelchair, Artist,  Hand-held shower hose,  Grab bars in shower,  Bedside commode/3-in-1,  Blood pressure cuff,  Cane (specify quad or straight),  CBG Meter,  Grab bars around toilet,  Scales]  Wheelchair Standard  PCP/Specialist Visits Compliance with follow-up visit  [Encouraged]  Communication with PCP/Specialists, Charity fundraiser, Pharmacists, Social Work  [employer] Routine Engagement]  Level of Care Adult Daycare,  Development worker, international aid, Air traffic controller, Assisted Living, Skilled Nursing Facility  [Encouraged Consideration]  Applications Medicaid, Personal Care Services, [place]  [Encouraged Consideration]  Exercise Interventions   Exercise Discussed/Reviewed Exercise Discussed, Assistive device use and maintanence, [employer] Managment, Physical Activity, Exercise Reviewed  [Encouraged]  Physical Activity Discussed/Reviewed Physical Activity Discussed, Home Exercise Program (HEP), PREP, Physical Activity Reviewed, Gym, Types of exercise  [Encouraged]  Weight Management Weight loss  [Encouraged]  Education Interventions   Education Provided Provided Therapist, sports, Provided Web-based Education, Provided Education  [Encouraged Review]  Provided Engineer, petroleum On Nutrition, Mental Health/Coping with Illness, When to see the doctor, Air traffic controller, [employer], [employer], Medication, Exercise, Blood Sugar Monitoring, Labs, Eye Care, Foot Care  [Encouraged]  Labs Reviewed Hgb A1c  Applications Medicaid, Personal Care Services, [place]  [Encouraged Consideration]  Mental Health Interventions   Mental Health Discussed/Reviewed Mental Health Discussed, Mental Health Reviewed, Coping Strategies, Crisis, Anxiety, Depression, Grief and Loss, Substance Abuse, Suicide  [Assessed Mental Health Status]  Nutrition Interventions   Nutrition Discussed/Reviewed Nutrition Discussed, Adding fruits and vegetables, Increasing proteins, Decreasing fats, Fluid intake, Nutrition Reviewed, Carbohydrate meal planning, Portion sizes, Decreasing salt, Decreasing sugar intake  [Encouraged]  Pharmacy Interventions   Pharmacy Dicussed/Reviewed Pharmacy Topics Discussed, Medications and their functions, Medication Adherence, Pharmacy Topics Reviewed, Affording Medications  [Confirmed Ability to Afford Medications]  Medication Adherence --  [Confirmed Compliance with Medication Administration]  Safety Interventions   Safety  Discussed/Reviewed Safety Discussed, Safety Reviewed, Fall Risk, Home Safety  [Encouraged Consideration of Home Safety Evaluation]  Home Safety Assistive Devices  [Encouraged Routine Use of Assistive Devices]  Advanced Directive Interventions   Advanced Directives Discussed/Reviewed Advanced Directives Discussed  [Encouraged Initiation, Offering to [employer] & Assist with Completion]  Assessed Social Determinant of Health Barriers. Discussed Plans for Ongoing Care Management Follow Up. Provided Careers information officer Information for Care Management Team Members. Screened for Signs & Symptoms of Depression, Related to Chronic Disease State.  PHQ2 & PHQ9 Depression Screen Completed & Results Reviewed.  Suicidal Ideation & Homicidal Ideation Assessed - None Present.   Domestic Violence Assessed - None Present. Access to Weapons Assessed - None Present.   Active Listening & Reflection Utilized.  Verbalization of Feelings Encouraged.  Emotional Support Provided. Feelings of Frustration & Anger Acknowledged.  Caregiver Burnout, Stress & Fatigue Validated. Caregiver Resources Reviewed. Caregiver Support Groups Mailed. Crisis Support Information, Agencies, Services & Resources Discussed. Problem Solving Interventions Identified. Task-Centered Solutions Implemented.   Solution-Focused Strategies Developed. Acceptance & Commitment Therapy Introduced. Brief Cognitive Behavioral Therapy Initiated. Client-Centered Therapy Enacted. Reviewed Prescription Medications & Discussed Importance of Compliance. Quality of Sleep Assessed & Sleep Hygiene Techniques Promoted. Reviewed & Encouraged Daily Implementation of Deep Breathing Exercises, Relaxation Techniques & Mindfulness Meditation Strategies. Encouraged Routine Engagement with Danford Bad, Licensed Clinical Social Worker with [facility] [phone]), if You Have Questions, Need Assistance or If Additional Social Work Needs Are  Identified Between Now & Our Next Follow-Up Outreach Call, Scheduled on 08/23/2023 at 1:15 PM.      Our next appointment is by telephone on 08/23/2023 at 1:15 pm.  Please call the care guide team at [phone] if you need to cancel or reschedule your appointment.   If you are experiencing a Mental Health or Behavioral Health Crisis or need someone to talk to, please call the Suicide and Crisis Lifeline: 988 call the [place] National Suicide Prevention Lifeline: (319)391-6710 or TTY: 907 611 6827 TTY (757) 409-8021) to talk to a trained counselor call 1-800-273-TALK (toll free, 24 hour hotline) go to [facility] [address] [place] [phone]) call the [facility] Crisis Line: [phone] call 911  Patient verbalizes understanding of instructions and care plan provided today and agrees to view in MyChart. Active MyChart status and patient understanding of how to access instructions and care plan via MyChart confirmed with patient.     Telephone follow up appointment with care management team member scheduled for:  08/23/2023 at 1:15 pm.  Danford Bad, BSW, MSW, LCSW  Embedded Practice Social Work Case Manager  [facility], Population Health Direct Dial: [phone]  Fax: [phone] Email: [email or web address] Website: [email or web address]

## 2023-08-09 NOTE — Patient Outreach (Signed)
Care Coordination   Initial Visit Note   08/09/2023  Name: Timothy Robertson MRN: [id number] DOB: 05/13/1946  Timothy Robertson is a 77 y.o. year old male who sees [facility]. I spoke with Timothy Robertson by phone today.  What matters to the patients health and wellness today?  EMMI- Receive Counseling & Supportive Services to Reduce & Manage Symptoms of Anger.    Goals Addressed               This Visit's Progress     EMMI- Receive Counseling & Supportive Services to Reduce & Manage Symptoms of Anger. (pt-stated)   On track     Care Coordination Interventions:  Interventions Today    Flowsheet Row Most Recent Value  Chronic Disease   Chronic disease during today's visit Diabetes, Other  [Transient Ischemic Attack, Stroke, Peripheral Vascular Disease, Coronary Artery Disease, Caregiver Burnout, Stress & Fatigue, Anger Issues, Marital Discord]  General Interventions   General Interventions Discussed/Reviewed General Interventions Discussed, Labs, Vaccines, Doctor Visits, Health Screening, Annual Foot Exam, General Interventions Reviewed, Annual Eye Exam, Durable Medical Equipment (DME), Community Resources, Level of Care, Communication with  [Communication with Care Team Members]  Labs Hgb A1c every 3 months, Kidney Function  [Encouraged]  Vaccines COVID-19, Flu, Pneumonia, RSV, Shingles, Tetanus/Pertussis/Diphtheria  [Encouraged]  Doctor Visits Discussed/Reviewed Doctor Visits Discussed, Specialist, Doctor Visits Reviewed, Annual Wellness Visits, PCP  [Encouraged Routine Engagement]  Health Screening Prostate, Colonoscopy, Bone Density  [Encouraged]  Durable Medical Equipment (DME) Other, Bed side commode, BP Cuff, Glucomoter, Environmental consultant, Wheelchair, Artist,  Hand-held shower hose,  Grab bars in shower,  Bedside commode/3-in-1,  Blood pressure cuff,  Cane (specify quad or straight),  CBG Meter,  Grab bars around toilet,  Scales]   Wheelchair Standard  PCP/Specialist Visits Compliance with follow-up visit  [Encouraged]  Communication with PCP/Specialists, Charity fundraiser, Pharmacists, Social Work  [employer] Routine Engagement]  Level of Care Adult Daycare, Development worker, international aid, Air traffic controller, Assisted Living, Skilled Nursing Facility  [Encouraged Consideration]  Applications Medicaid, Personal Care Services, [place]-2  [Encouraged Consideration]  Exercise Interventions   Exercise Discussed/Reviewed Exercise Discussed, Assistive device use and maintanence, [employer] Managment, Physical Activity, Exercise Reviewed  [Encouraged]  Physical Activity Discussed/Reviewed Physical Activity Discussed, Home Exercise Program (HEP), PREP, Physical Activity Reviewed, Gym, Types of exercise  [Encouraged]  Weight Management Weight loss  [Encouraged]  Education Interventions   Education Provided Provided Therapist, sports, Provided Web-based Education, Provided Education  [Encouraged Review]  Provided Engineer, petroleum On Nutrition, Mental Health/Coping with Illness, When to see the doctor, Air traffic controller, [employer], [employer], Medication, Exercise, Blood Sugar Monitoring, Labs, Eye Care, Foot Care  [Encouraged]  Labs Reviewed Hgb A1c  Applications Medicaid, Personal Care Services, [place]  [Encouraged Consideration]  Mental Health Interventions   Mental Health Discussed/Reviewed Mental Health Discussed, Mental Health Reviewed, Coping Strategies, Crisis, Anxiety, Depression, Grief and Loss, Substance Abuse, Suicide  [Assessed Mental Health Status]  Nutrition Interventions   Nutrition Discussed/Reviewed Nutrition Discussed, Adding fruits and vegetables, Increasing proteins, Decreasing fats, Fluid intake, Nutrition Reviewed, Carbohydrate meal planning, Portion sizes, Decreasing salt, Decreasing sugar intake  [Encouraged]  Pharmacy Interventions   Pharmacy Dicussed/Reviewed Pharmacy Topics Discussed, Medications and their functions, Medication  Adherence, Pharmacy Topics Reviewed, Affording Medications  [Confirmed Ability to Afford Medications]  Medication Adherence --  [Confirmed Compliance with Medication Administration]  Safety Interventions   Safety Discussed/Reviewed Safety Discussed, Safety Reviewed, Fall Risk, Home Safety  [Encouraged Consideration of Home Safety Evaluation]  Home Safety Assistive  Devices  [Encouraged Routine Use of Assistive Devices]  Advanced Directive Interventions   Advanced Directives Discussed/Reviewed Advanced Directives Discussed  [Encouraged Initiation, Offering to [employer] & Assist with Completion]      Assessed Social Determinant of Health Barriers. Discussed Plans for Ongoing Care Management Follow Up. Provided Careers information officer Information for Care Management Team Members. Screened for Signs & Symptoms of Depression, Related to Chronic Disease State.  PHQ2 & PHQ9 Depression Screen Completed & Results Reviewed.  Suicidal Ideation & Homicidal Ideation Assessed - None Present.   Domestic Violence Assessed - None Present. Access to Weapons Assessed - None Present.   Active Listening & Reflection Utilized.  Verbalization of Feelings Encouraged.  Emotional Support Provided. Feelings of Frustration & Anger Acknowledged.  Caregiver Burnout, Stress & Fatigue Validated. Caregiver Resources Reviewed. Caregiver Support Groups Mailed. Crisis Support Information, Agencies, Services & Resources Discussed. Problem Solving Interventions Identified. Task-Centered Solutions Implemented.   Solution-Focused Strategies Developed. Acceptance & Commitment Therapy Introduced. Brief Cognitive Behavioral Therapy Initiated. Client-Centered Therapy Enacted. Reviewed Prescription Medications & Discussed Importance of Compliance. Quality of Sleep Assessed & Sleep Hygiene Techniques Promoted. Reviewed & Encouraged Daily Implementation of Deep Breathing Exercises, Relaxation Techniques & Mindfulness Meditation  Strategies. Encouraged Routine Engagement with Timothy Robertson, Licensed Clinical Social Worker with [facility] [phone]), if You Have Questions, Need Assistance or If Additional Social Work Needs Are Identified Between Now & Our Next Follow-Up Outreach Call, Scheduled on 08/23/2023 at 1:15 PM.        SDOH assessments and interventions completed:  Yes.  SDOH Interventions Today    Flowsheet Row Most Recent Value  SDOH Interventions   Food Insecurity Interventions Intervention Not Indicated  Housing Interventions Intervention Not Indicated  Transportation Interventions Intervention Not Indicated, Patient Resources (Friends/Family)  Utilities Interventions Intervention Not Indicated  Alcohol Usage Interventions Intervention Not Indicated (Score <7)  Financial Strain Interventions Intervention Not Indicated  Physical Activity Interventions Intervention Not Indicated, Patient Declined  Stress Interventions Offered [employer] Resources, Provide Counseling, Other (Comment)  [Provided Counseling Resources, Receiving Counseling Services through [place] & Social Worker through [facility]]  Social Connections Interventions Intervention Not Indicated  Health Literacy Interventions Intervention Not Indicated     Care Coordination Interventions:  Yes, provided.   Follow up plan: Follow up call scheduled for 08/23/2023 at 1:15 pm.  Encounter Outcome:  Patient Visit Completed.   Timothy Robertson, BSW, MSW, Printmaker Social Work Case Set designer Health  [facility], Population Health Direct Dial: [phone]  Fax: [phone] Email: [email or web address]: [email or web address]

## 2023-08-22 DIAGNOSIS — I10 Essential (primary) hypertension: Secondary | ICD-10-CM | POA: Diagnosis not present

## 2023-08-22 DIAGNOSIS — Z299 Encounter for prophylactic measures, unspecified: Secondary | ICD-10-CM | POA: Diagnosis not present

## 2023-08-22 DIAGNOSIS — I509 Heart failure, unspecified: Secondary | ICD-10-CM | POA: Diagnosis not present

## 2023-08-22 DIAGNOSIS — I25119 Atherosclerotic heart disease of native coronary artery with unspecified angina pectoris: Secondary | ICD-10-CM | POA: Diagnosis not present

## 2023-08-22 DIAGNOSIS — E1169 Type 2 diabetes mellitus with other specified complication: Secondary | ICD-10-CM | POA: Diagnosis not present

## 2023-08-22 DIAGNOSIS — I5022 Chronic systolic (congestive) heart failure: Secondary | ICD-10-CM | POA: Diagnosis not present

## 2023-08-22 DIAGNOSIS — I700 Atherosclerosis of aorta: Secondary | ICD-10-CM | POA: Diagnosis not present

## 2023-08-23 ENCOUNTER — Encounter: Payer: Self-pay | Admitting: *Deleted

## 2023-08-23 ENCOUNTER — Ambulatory Visit: Payer: Self-pay | Admitting: *Deleted

## 2023-08-23 NOTE — Patient Outreach (Signed)
Care Coordination   Follow Up Visit Note   08/23/2023  Name: Timothy Robertson MRN: [id number] DOB: 1946-04-20  Timothy Robertson is a 77 y.o. year old male who [facility]. I spoke with Timothy Robertson by phone today.  What matters to the patients health and wellness today?  EMMI- Receive Counseling & Supportive Services to Reduce & Manage Symptoms of Anger.    Goals Addressed               This Visit's Progress     EMMI- Receive Counseling & Supportive Services to Reduce & Manage Symptoms of Anger. (pt-stated)   On track     Care Coordination Interventions:  Interventions Today    Flowsheet Row Most Recent Value  Chronic Disease   Chronic disease during today's visit Diabetes, Other  [Transient Ischemic Attack, Stroke, Peripheral Vascular Disease, Coronary Artery Disease, Caregiver Burnout, Stress & Fatigue, Anger Issues, Marital Discord]  General Interventions   General Interventions Discussed/Reviewed General Interventions Discussed, Labs, Vaccines, Doctor Visits, Health Screening, Annual Foot Exam, General Interventions Reviewed, Annual Eye Exam, Durable Medical Equipment (DME), Community Resources, Level of Care, Communication with  [Communication with Care Team Members]  Labs Hgb A1c every 3 months, Kidney Function  [Encouraged]  Vaccines COVID-19, Flu, Pneumonia, RSV, Shingles, Tetanus/Pertussis/Diphtheria  [Encouraged]  Doctor Visits Discussed/Reviewed Doctor Visits Discussed, Specialist, Doctor Visits Reviewed, Annual Wellness Visits, PCP  [Encouraged Routine Engagement]  Health Screening Prostate, Colonoscopy, Bone Density  [Encouraged]  Durable Medical Equipment (DME) Other, Bed side commode, BP Cuff, Glucomoter, Environmental consultant, Wheelchair, Artist,  Hand-held shower hose,  Grab bars in shower,  Bedside commode/3-in-1,  Blood pressure cuff,  Cane (specify quad or straight),  CBG Meter,  Grab bars around toilet,  Scales]   Wheelchair Standard  PCP/Specialist Visits Compliance with follow-up visit  [Encouraged]  Communication with PCP/Specialists, Charity fundraiser, Pharmacists, Social Work  [employer] Routine Engagement]  Level of Care Adult Daycare, Development worker, international aid, Air traffic controller, Assisted Living, Skilled Nursing Facility  [Encouraged Consideration]  Applications Medicaid, Personal Care Services, [place]-2  [Encouraged Consideration]  Exercise Interventions   Exercise Discussed/Reviewed Exercise Discussed, Assistive device use and maintanence, [employer] Managment, Physical Activity, Exercise Reviewed  [Encouraged]  Physical Activity Discussed/Reviewed Physical Activity Discussed, Home Exercise Program (HEP), PREP, Physical Activity Reviewed, Gym, Types of exercise  [Encouraged]  Weight Management Weight loss  [Encouraged]  Education Interventions   Education Provided Provided Therapist, sports, Provided Web-based Education, Provided Education  [Encouraged Review]  Provided Engineer, petroleum On Nutrition, Mental Health/Coping with Illness, When to see the doctor, Air traffic controller, [employer], [employer], Medication, Exercise, Blood Sugar Monitoring, Labs, Eye Care, Foot Care  [Encouraged]  Labs Reviewed Hgb A1c  Applications Medicaid, Personal Care Services, [place]  [Encouraged Consideration]  Mental Health Interventions   Mental Health Discussed/Reviewed Mental Health Discussed, Mental Health Reviewed, Coping Strategies, Crisis, Anxiety, Depression, Grief and Loss, Substance Abuse, Suicide  [Assessed Mental Health Status]  Nutrition Interventions   Nutrition Discussed/Reviewed Nutrition Discussed, Adding fruits and vegetables, Increasing proteins, Decreasing fats, Fluid intake, Nutrition Reviewed, Carbohydrate meal planning, Portion sizes, Decreasing salt, Decreasing sugar intake  [Encouraged]  Pharmacy Interventions   Pharmacy Dicussed/Reviewed Pharmacy Topics Discussed, Medications and their functions, Medication  Adherence, Pharmacy Topics Reviewed, Affording Medications  [Confirmed Ability to Afford Medications]  Medication Adherence --  [Confirmed Compliance with Medication Administration]  Safety Interventions   Safety Discussed/Reviewed Safety Discussed, Safety Reviewed, Fall Risk, Home Safety  [Encouraged Consideration of Home Safety Evaluation]  Home Safety  Assistive Devices  [Encouraged Routine Use of Assistive Devices]  Advanced Directive Interventions   Advanced Directives Discussed/Reviewed Advanced Directives Discussed  [Encouraged Initiation, Offering to [employer] & Assist with Completion]      Active Listening & Reflection Utilized.  Verbalization of Feelings Encouraged.  Emotional Support Provided. Caregiver Resources Revisited. Problem Solving Interventions Employed. Task-Centered Solutions Activated.   Solution-Focused Strategies Implemented. Acceptance & Commitment Therapy Initiated. Cognitive Behavioral Therapy Indicated. Client-Centered Therapy Performed. Encouraged Review of Educational Material & Be Prepared to Discuss During Next Follow-Up Outreach Call, Mailed on 08/23/2023:          ~ Control Anger Before It Controls You          ~ Are You Grieving - Support Through Loss & Transition          ~ [facility] Resources Encouraged Review of Support Groups & Self-Enrollment in Support Groups of Interest in [place], Mailed on 08/23/2023:          ~ Support Groups in [place] ~ Producer, television/film/video ~ Grief & Loss Support Groups through [employer] Compassionate Care Encouraged Routine Engagement with Danford Bad, Licensed Clinical Social Worker with [facility] [phone]), if You Have Questions, Need Assistance or If Additional Social Work Needs Are Identified Between Now & Our Next Follow-Up Outreach Call, Scheduled on 09/16/2023 at 11:30 AM.      SDOH assessments and interventions completed:  Yes.  Care Coordination Interventions:  Yes,  provided.   Follow up plan: Follow up call scheduled for 09/16/2023 at 11:30 am.  Encounter Outcome:  Patient Visit Completed.   Danford Bad, BSW, MSW, Printmaker Social Work Case Set designer Health  [facility], Population Health Direct Dial: [phone]  Fax: [phone] Email: [email or web address]: [email or web address]

## 2023-08-23 NOTE — Patient Instructions (Signed)
Visit Information  Thank you for taking time to visit with me today. Please don't hesitate to contact me if I can be of assistance to you.   Following are the goals we discussed today:   Goals Addressed               This Visit's Progress     EMMI- Receive Counseling & Supportive Services to Reduce & Manage Symptoms of Anger. (pt-stated)   On track     Care Coordination Interventions:  Interventions Today    Flowsheet Row Most Recent Value  Chronic Disease   Chronic disease during today's visit Diabetes, Other  [Transient Ischemic Attack, Stroke, Peripheral Vascular Disease, Coronary Artery Disease, Caregiver Burnout, Stress & Fatigue, Anger Issues, Marital Discord]  General Interventions   General Interventions Discussed/Reviewed General Interventions Discussed, Labs, Vaccines, Doctor Visits, Health Screening, Annual Foot Exam, General Interventions Reviewed, Annual Eye Exam, Durable Medical Equipment (DME), Community Resources, Level of Care, Communication with  [Communication with Care Team Members]  Labs Hgb A1c every 3 months, Kidney Function  [Encouraged]  Vaccines COVID-19, Flu, Pneumonia, RSV, Shingles, Tetanus/Pertussis/Diphtheria  [Encouraged]  Doctor Visits Discussed/Reviewed Doctor Visits Discussed, Specialist, Doctor Visits Reviewed, Annual Wellness Visits, PCP  [Encouraged Routine Engagement]  Health Screening Prostate, Colonoscopy, Bone Density  [Encouraged]  Durable Medical Equipment (DME) Other, Bed side commode, BP Cuff, Glucomoter, Environmental consultant, Wheelchair, Artist,  Hand-held shower hose,  Grab bars in shower,  Bedside commode/3-in-1,  Blood pressure cuff,  Cane (specify quad or straight),  CBG Meter,  Grab bars around toilet,  Scales]  Wheelchair Standard  PCP/Specialist Visits Compliance with follow-up visit  [Encouraged]  Communication with PCP/Specialists, Charity fundraiser, Pharmacists, Social Work  [employer] Routine Engagement]  Level of Care Adult Daycare,  Development worker, international aid, Air traffic controller, Assisted Living, Skilled Nursing Facility  [Encouraged Consideration]  Applications Medicaid, Personal Care Services, [place]  [Encouraged Consideration]  Exercise Interventions   Exercise Discussed/Reviewed Exercise Discussed, Assistive device use and maintanence, [employer] Managment, Physical Activity, Exercise Reviewed  [Encouraged]  Physical Activity Discussed/Reviewed Physical Activity Discussed, Home Exercise Program (HEP), PREP, Physical Activity Reviewed, Gym, Types of exercise  [Encouraged]  Weight Management Weight loss  [Encouraged]  Education Interventions   Education Provided Provided Therapist, sports, Provided Web-based Education, Provided Education  [Encouraged Review]  Provided Engineer, petroleum On Nutrition, Mental Health/Coping with Illness, When to see the doctor, Air traffic controller, [employer], [employer], Medication, Exercise, Blood Sugar Monitoring, Labs, Eye Care, Foot Care  [Encouraged]  Labs Reviewed Hgb A1c  Applications Medicaid, Personal Care Services, [place]  [Encouraged Consideration]  Mental Health Interventions   Mental Health Discussed/Reviewed Mental Health Discussed, Mental Health Reviewed, Coping Strategies, Crisis, Anxiety, Depression, Grief and Loss, Substance Abuse, Suicide  [Assessed Mental Health Status]  Nutrition Interventions   Nutrition Discussed/Reviewed Nutrition Discussed, Adding fruits and vegetables, Increasing proteins, Decreasing fats, Fluid intake, Nutrition Reviewed, Carbohydrate meal planning, Portion sizes, Decreasing salt, Decreasing sugar intake  [Encouraged]  Pharmacy Interventions   Pharmacy Dicussed/Reviewed Pharmacy Topics Discussed, Medications and their functions, Medication Adherence, Pharmacy Topics Reviewed, Affording Medications  [Confirmed Ability to Afford Medications]  Medication Adherence --  [Confirmed Compliance with Medication Administration]  Safety Interventions   Safety  Discussed/Reviewed Safety Discussed, Safety Reviewed, Fall Risk, Home Safety  [Encouraged Consideration of Home Safety Evaluation]  Home Safety Assistive Devices  [Encouraged Routine Use of Assistive Devices]  Advanced Directive Interventions   Advanced Directives Discussed/Reviewed Advanced Directives Discussed  [Encouraged Initiation, Offering to [employer] & Assist with Completion]  Active Listening & Reflection Utilized.  Verbalization of Feelings Encouraged.  Emotional Support Provided. Caregiver Resources Revisited. Problem Solving Interventions Employed. Task-Centered Solutions Activated.   Solution-Focused Strategies Implemented. Acceptance & Commitment Therapy Initiated. Cognitive Behavioral Therapy Indicated. Client-Centered Therapy Performed. Encouraged Review of Educational Material & Be Prepared to Discuss During Next Follow-Up Outreach Call, Mailed on 08/23/2023:  ~ Control Anger Before It Controls You  ~ Are You Grieving - Support Through Loss & Transition  ~ [facility] Resources Encouraged Review of Support Groups & Self-Enrollment in Support Groups of Interest in [place], Mailed on 08/23/2023:  ~ Support Groups in [place]     ~ Producer, television/film/video ~ Grief & Loss Support Groups through [employer] Compassionate Care Encouraged Routine Engagement with Danford Bad, Licensed Clinical Social Worker with [employer] [phone]), if You Have Questions, Need Assistance or If Additional Social Work Needs Are Identified Between Now & Our Next Follow-Up Outreach Call, Scheduled on 09/16/2023 at 11:30 AM.      Our next appointment is by telephone on 09/16/2023 at 11:30 am.  Please call the care guide team at [phone] if you need to cancel or reschedule your appointment.   If you are experiencing a Mental Health or Behavioral Health Crisis or need someone to talk to, please call the Suicide and Crisis Lifeline: 988 call the [place] National  Suicide Prevention Lifeline: 564-041-1551 or TTY: 306 672 9889 TTY 2187947039) to talk to a trained counselor call 1-800-273-TALK (toll free, 24 hour hotline) go to [facility] [address] [place] [phone]) call the [facility] Crisis Line: [phone] call 911  Patient verbalizes understanding of instructions and care plan provided today and agrees to view in MyChart. Active MyChart status and patient understanding of how to access instructions and care plan via MyChart confirmed with patient.     Telephone follow up appointment with care management team member scheduled for:   09/16/2023 at 11:30 am.  Danford Bad, BSW, MSW, LCSW  Embedded Practice Social Work Case Manager  [facility], Population Health Direct Dial: [phone]  Fax: [phone] Email: [email or web address]

## 2023-09-16 ENCOUNTER — Ambulatory Visit: Payer: Self-pay | Admitting: *Deleted

## 2023-09-16 NOTE — Patient Outreach (Signed)
 Care Coordination   Follow Up Visit Note   09/16/2023  Name: Timothy Robertson MRN: [id number] DOB: 1946/09/08  Timothy Robertson is a 78 y.o. year old male who sees [facility] for [facility]. I spoke with Belvie Needle by phone today.  What matters to the patients health and wellness today?  EMMI- Receive Counseling & Supportive Services to Reduce & Manage Symptoms of Anger.    Goals Addressed               This Visit's Progress     EMMI- Receive Counseling & Supportive Services to Reduce & Manage Symptoms of Anger. 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, Shoulder Pain.]  General Interventions   General Interventions Discussed/Reviewed General Interventions Discussed, Labs, Vaccines, Doctor Visits, Health Screening, Annual Foot Exam, Lipid Profile, General Interventions Reviewed, Annual Eye Exam, Durable Medical Equipment (DME), Community Resources, Level of Care, Communication with  [Encouraged Routine Engagement with Care Team Members & Providers.]  Labs Hgb A1c every 3 months, Kidney Function  [Encouraged Routine Labwork.]  Vaccines COVID-19, Flu, Pneumonia, RSV, Shingles, Tetanus/Pertussis/Diphtheria  [Encouraged Routine Vaccinations.]  Doctor Visits Discussed/Reviewed Doctor Visits Discussed, Specialist, Doctor Visits Reviewed, Annual Wellness Visits, PCP  [Encouraged Routine Engagement with Care Team Members & Providers.]  Health Screening Bone Density, Colonoscopy, Prostate  [Encouraged Routine Health Screenings.]  Durable Medical Equipment (DME) Bed side commode, BP  Cuff, Glucomoter, Environmental Consultant, Wheelchair, Tour manager, Other  [Eyeglasses,  Hand-held shower hose,  Grab bars in shower,  Bedside commode/3-in-1,  Blood pressure cuff,  Cane (specify quad or straight),  CBG Meter,  Grab bars around toilet,  Scales.]  Wheelchair Standard  PCP/Specialist Visits Compliance with follow-up visit  [Encouraged Routine Engagement with Care Team Members & Providers.]  Communication with PCP/Specialists, RN, Pharmacists, Social Work  [employer] Routine Engagement with Care Team Members & Providers.]  Level of Care Adult Daycare, Air Traffic Controller, Assisted Living, Skilled Nursing Facility  [Confirmed Disinterest in Enrollment in Adult Day Care Program or Receiving Assistance Pursuing Higher Level of Care Placement Options (I.e Assisted Living Versus Skilled Nursing Facility).]  Applications Medicaid, Personal Care Services, Other  [Confirmed Disinterest in Applying for Medicaid, Personal Care Services or Aid & Attendance Benefits.]  Exercise Interventions   Exercise Discussed/Reviewed Exercise Discussed, Assistive device use and maintanence, Exercise Reviewed, Physical Activity, Weight Managment  [Encouraged Daily Exercise Regimen, as Tolerated.]  Physical Activity Discussed/Reviewed Physical Activity Discussed, Home Exercise Program (HEP), PREP, Gym, Physical Activity Reviewed, Types of exercise  [Encouraged Increased Level of Activity & Exercise, Inside & Outside the Home.]  Weight Management Weight loss  [Encouraged Healthy Weight Loss Program.]  Education Interventions   Education Provided Provided Education  [employer] Reviewed Educational Material & Encouraged Consideration of Implementation.]  Provided Verbal Education On [employer], Development Worker, Community, Blood Sugar Monitoring, Exercise, Medication, Labs, Applications, Eye Care, Foot Care, Nutrition, Mental Health/Coping with Illness, When to see the doctor  [Encouraged Continued Independent Review of Educational Material.]   Labs Reviewed Hgb A1c  [Reviewed & Encouraged Keeping a Log of Blood Sugar Readings.]  Applications Medicaid, Personal Care Services, Other  [Confirmed Disinterest in Applying for Medicaid, Personal Care Services or Aid & Attendance  Benefits.]  Mental Health Interventions   Mental Health Discussed/Reviewed Mental Health Discussed, Mental Health Reviewed, Crisis, Anxiety, Depression, Grief and Loss, Substance Abuse, Suicide, Coping Strategies, Other  [Assessed Mental Health & Cognitive Status.]  Nutrition Interventions   Nutrition Discussed/Reviewed Nutrition Discussed, Adding fruits and vegetables, Increasing proteins, Fluid intake, Decreasing fats, Nutrition Reviewed, Carbohydrate meal planning, Portion sizes, Decreasing salt, Decreasing sugar intake  [Encouraged Diabetic-Friendly, Low Sodium, Reduced Sugar, Low-Fat Diet.]  Pharmacy Interventions   Pharmacy Dicussed/Reviewed Pharmacy Topics Discussed, Medications and their functions, Medication Adherence, Pharmacy Topics Reviewed, Affording Medications  [Confirmed Ability to Afford Prescription Medications.]  Medication Adherence --  [Confirmed Compliance with Prescription Medications.]  Safety Interventions   Safety Discussed/Reviewed Safety Discussed, Safety Reviewed, Fall Risk, Home Safety  [Encouraged Routine Use of Assistive Devices & Durable Medical Equipment.]  Home Safety Assistive Devices, Need for home safety assessment, Refer for community resources  [Encouraged Consideration of Home Safety Evaluation.]  Advanced Directive Interventions   Advanced Directives Discussed/Reviewed Advanced Directives Discussed, Advanced Directives Reviewed  [Encouraged Initation of Advanced Directives (Living Will & Healthcare Power of Corporate Treasurer), Offering to [employer], Assist with Completion, Make Copies & Scan into Electronic Medical Record in Epic.]      Active Listening & Reflection Utilized.  Verbalization of Feelings Encouraged.   Emotional Support Provided.   Solution-Focused Strategies Employed. Acceptance & Commitment Therapy Performed. Cognitive Behavioral Therapy Initiated. Client-Centered Therapy Indicated. Confirmed Receipt, Thoroughly Reviewed & Encouraged Continued Independent Review of Educational Material, to Ensure Understanding:    ~ Control Anger Before It Controls You   ~ Are You Grieving - Support Through Loss & Transition   ~ [facility] Resources Confirmed Receipt, Thoroughly Reviewed & Encouraged Self-Enrollment in Support Group(s) of Interest in [place], from List Provided:   ~ Support Groups in [place] ~ Producer, Television/film/video ~ Grief & Loss Support Groups through [employer] Compassionate Care Encouraged Routine Engagement with Philippe Desanctis, Licensed Clinical Social Worker with [facility] [phone]), if You Have Questions, Need Assistance or If Additional Social Work Needs Are Identified Between Now & Our Next Follow-Up Outreach Call, Scheduled on 10/06/2023 at 1:00 PM.      SDOH assessments and interventions completed:  Yes.  Care Coordination Interventions:  Yes, provided.   Follow up plan: Follow up call scheduled for 10/06/2023 at 1:00 pm.  Encounter Outcome:  Patient Visit Completed.   Philippe Desanctis, BSW, MSW, Printmaker Social Work Case Set Designer Health  [facility], Population Health Direct Dial: [phone]  Fax: [phone] Email: [email or web address]

## 2023-09-16 NOTE — Patient Instructions (Signed)
 Visit Information  Thank you for taking time to visit with me today. Please don't hesitate to contact me if I can be of assistance to you.   Following are the goals we discussed today:   Goals Addressed               This Visit's Progress     EMMI- Receive Counseling & Supportive Services to Reduce & Manage Symptoms of Anger. 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, Shoulder Pain.]  General Interventions   General Interventions Discussed/Reviewed General Interventions Discussed, Labs, Vaccines, Doctor Visits, Health Screening, Annual Foot Exam, Lipid Profile, General Interventions Reviewed, Annual Eye Exam, Durable Medical Equipment (DME), Community Resources, Level of Care, Communication with  [Encouraged Routine Engagement with Care Team Members & Providers.]  Labs Hgb A1c every 3 months, Kidney Function  [Encouraged Routine Labwork.]  Vaccines COVID-19, Flu, Pneumonia, RSV, Shingles, Tetanus/Pertussis/Diphtheria  [Encouraged Routine Vaccinations.]  Doctor Visits Discussed/Reviewed Doctor Visits Discussed, Specialist, Doctor Visits Reviewed, Annual Wellness Visits, PCP  [Encouraged Routine Engagement with Care Team Members & Providers.]  Health Screening Bone Density, Colonoscopy, Prostate  [Encouraged Routine Health Screenings.]  Durable Medical Equipment (DME) Bed side commode, BP Cuff, Glucomoter, Environmental Consultant, Wheelchair, Tour manager, Other  [Eyeglasses,  Hand-held shower hose,  Grab bars in shower,  Bedside commode/3-in-1,  Blood pressure cuff,  Cane (specify quad or straight),  CBG Meter,  Grab bars  around toilet,  Scales.]  Wheelchair Standard  PCP/Specialist Visits Compliance with follow-up visit  [Encouraged Routine Engagement with Care Team Members & Providers.]  Communication with PCP/Specialists, RN, Pharmacists, Social Work  [employer] Routine Engagement with Care Team Members & Providers.]  Level of Care Adult Daycare, Air Traffic Controller, Assisted Living, Skilled Nursing Facility  [Confirmed Disinterest in Enrollment in Adult Day Care Program or Receiving Assistance Pursuing Higher Level of Care Placement Options (I.e Assisted Living Versus Skilled Nursing Facility).]  Applications Medicaid, Personal Care Services, Other  [Confirmed Disinterest in Applying for Medicaid, Personal Care Services or Aid & Attendance Benefits.]  Exercise Interventions   Exercise Discussed/Reviewed Exercise Discussed, Assistive device use and maintanence, Exercise Reviewed, Physical Activity, Weight Managment  [Encouraged Daily Exercise Regimen, as Tolerated.]  Physical Activity Discussed/Reviewed Physical Activity Discussed, Home Exercise Program (HEP), PREP, Gym, Physical Activity Reviewed, Types of exercise  [Encouraged Increased Level of Activity & Exercise, Inside & Outside the Home.]  Weight Management Weight loss  [Encouraged Healthy Weight Loss Program.]  Education Interventions   Education Provided Provided Education  [employer] Reviewed Educational Material & Encouraged Consideration of Implementation.]  Provided Verbal Education On [employer], Development Worker, Community, Blood Sugar Monitoring, Exercise, Medication, Labs, Applications, Eye Care, Foot Care, Nutrition, Mental Health/Coping with Illness, When to see the doctor  [Encouraged Continued Independent Review of Educational Material.]  Labs Reviewed Hgb A1c  [Reviewed & Encouraged Keeping a Log of Blood Sugar Readings.]  Applications Medicaid, Personal Care Services, Other  [Confirmed Disinterest in Applying for Medicaid, Personal Care Services or Aid  & Attendance Benefits.]  Mental Health Interventions   Mental Health Discussed/Reviewed Mental Health Discussed, Mental Health Reviewed, Crisis, Anxiety, Depression, Grief and Loss, Substance Abuse, Suicide, Coping Strategies, Other  [Assessed Mental Health & Cognitive  Status.]  Nutrition Interventions   Nutrition Discussed/Reviewed Nutrition Discussed, Adding fruits and vegetables, Increasing proteins, Fluid intake, Decreasing fats, Nutrition Reviewed, Carbohydrate meal planning, Portion sizes, Decreasing salt, Decreasing sugar intake  [Encouraged Diabetic-Friendly, Low Sodium, Reduced Sugar, Low-Fat Diet.]  Pharmacy Interventions   Pharmacy Dicussed/Reviewed Pharmacy Topics Discussed, Medications and their functions, Medication Adherence, Pharmacy Topics Reviewed, Affording Medications  [Confirmed Ability to Afford Prescription Medications.]  Medication Adherence --  [Confirmed Compliance with Prescription Medications.]  Safety Interventions   Safety Discussed/Reviewed Safety Discussed, Safety Reviewed, Fall Risk, Home Safety  [Encouraged Routine Use of Assistive Devices & Durable Medical Equipment.]  Home Safety Assistive Devices, Need for home safety assessment, Refer for community resources  [Encouraged Consideration of Home Safety Evaluation.]  Advanced Directive Interventions   Advanced Directives Discussed/Reviewed Advanced Directives Discussed, Advanced Directives Reviewed  [Encouraged Initation of Advanced Directives (Living Will & Healthcare Power of Corporate Treasurer), Offering to [employer], Assist with Completion, Make Copies & Scan into Electronic Medical Record in Epic.]      Active Listening & Reflection Utilized.  Verbalization of Feelings Encouraged.  Emotional Support Provided.   Solution-Focused Strategies Employed. Acceptance & Commitment Therapy Performed. Cognitive Behavioral Therapy Initiated. Client-Centered Therapy Indicated. Confirmed Receipt, Thoroughly Reviewed  & Encouraged Continued Independent Review of Educational Material, to Ensure Understanding:    ~ Control Anger Before It Controls You   ~ Are You Grieving - Support Through Loss & Transition   ~ [facility] Resources Confirmed Receipt, Thoroughly Reviewed & Encouraged Self-Enrollment in Support Group(s) of Interest in [place], from List Provided:   ~ Support Groups in [facility] ~ Producer, Television/film/video ~ Grief & Loss Support Groups through [employer] Compassionate Care Encouraged Routine Engagement with Philippe Desanctis, Licensed Clinical Social Worker with [employer] [phone]), if You Have Questions, Need Assistance or If Additional Social Work Needs Are Identified Between Now & Our Next Follow-Up Outreach Call, Scheduled on 10/06/2023 at 1:00 PM.      Our next appointment is by telephone on 10/06/2023 at 1:00 pm.  Please call the care guide team at [phone] if you need to cancel or reschedule your appointment.   If you are experiencing a Mental Health or Behavioral Health Crisis or need someone to talk to, please call the Suicide and Crisis Lifeline: 988 call the [place]  National Suicide Prevention Lifeline: (203) 535-3462 or TTY: 604-685-3042 TTY 701-489-5297) to talk to a trained counselor call 1-800-273-TALK (toll free, 24 hour hotline) go to [facility] [address] [place] [phone]) call the [facility] Crisis Line: [phone] call 911  Patient verbalizes understanding of instructions and care plan provided today and agrees to view in MyChart. Active MyChart status and patient understanding of how to access instructions and care plan via MyChart confirmed with patient.     Telephone follow up appointment with care management team member scheduled for:   10/06/2023 at 1:00 pm.  Kaylin Marcon, BSW, MSW, LCSW  Embedded Practice Social Work Case Manager  [facility], Population  Health Direct Dial: [phone]  Fax: [phone] Email: [email or web address]: [email or web address]

## 2023-10-06 ENCOUNTER — Ambulatory Visit: Payer: Self-pay | Admitting: *Deleted

## 2023-10-06 NOTE — Patient Instructions (Signed)
Visit Information  Thank you for taking time to visit with me today. Please don't hesitate to contact me if I can be of assistance to you.   Following are the goals we discussed today:   Goals Addressed               This Visit's Progress     COMPLETED: EMMI- Receive Counseling & Supportive Services to Reduce & Manage Symptoms of Anger. 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, Shoulder Pain.]  General Interventions   General Interventions Discussed/Reviewed General Interventions Discussed, Labs, Vaccines, Doctor Visits, Health Screening, Annual Foot Exam, Lipid Profile, General Interventions Reviewed, Annual Eye Exam, Durable Medical Equipment (DME), Community Resources, Level of Care, Communication with  [Encouraged Routine Engagement with Care Team Members & Providers.]  Labs Hgb A1c every 3 months, Kidney Function  [Encouraged Routine Labwork.]  Vaccines COVID-19, Flu, Pneumonia, RSV, Shingles, Tetanus/Pertussis/Diphtheria  [Encouraged Routine Vaccinations.]  Doctor Visits Discussed/Reviewed Doctor Visits Discussed, Specialist, Doctor Visits Reviewed, Annual Wellness Visits, PCP  [Encouraged Routine Engagement with Care Team Members & Providers.]  Health Screening Bone Density, Colonoscopy, Prostate  [Encouraged Routine Health Screenings.]  Durable Medical Equipment (DME) Bed side commode, BP Cuff, Glucomoter, Environmental consultant, Wheelchair, Tour manager, Other  [Eyeglasses,  Hand-held shower hose,  Grab bars in shower,  Bedside commode/3-in-1,  Blood pressure cuff,  Cane (specify quad or straight),  CBG Meter,   Grab bars around toilet,  Scales.]  Wheelchair Standard  PCP/Specialist Visits Compliance with follow-up visit  [Encouraged Routine Engagement with Care Team Members & Providers.]  Communication with PCP/Specialists, RN, Pharmacists, Social Work  [employer] Routine Engagement with Care Team Members & Providers.]  Level of Care Adult Daycare, Air traffic controller, Assisted Living, Skilled Nursing Facility  [Confirmed Disinterest in Enrollment in Adult Day Care Program or Receiving Assistance Pursuing Higher Level of Care Placement Options (I.e Assisted Living Versus Skilled Nursing Facility).]  Applications Medicaid, Personal Care Services, Other  [Confirmed Disinterest in Applying for Medicaid, Personal Care Services or Aid & Attendance Benefits.]  Exercise Interventions   Exercise Discussed/Reviewed Exercise Discussed, Assistive device use and maintanence, Exercise Reviewed, Physical Activity, Weight Managment  [Encouraged Daily Exercise Regimen, as Tolerated.]  Physical Activity Discussed/Reviewed Physical Activity Discussed, Home Exercise Program (HEP), PREP, Gym, Physical Activity Reviewed, Types of exercise  [Encouraged Increased Level of Activity & Exercise, Inside & Outside the Home.]  Weight Management Weight loss  [Encouraged Healthy Weight Loss Program.]  Education Interventions   Education Provided Provided Education  [employer] Reviewed Educational Material & Encouraged Consideration of Implementation.]  Provided Verbal Education On [employer], Development worker, community, Blood Sugar Monitoring, Exercise, Medication, Labs, Applications, Eye Care, Foot Care, Nutrition, Mental Health/Coping with Illness, When to see the doctor  [Encouraged Continued Independent Review of Educational Material.]  Labs Reviewed Hgb A1c  [Reviewed & Encouraged Keeping a Log of Blood Sugar Readings.]  Applications Medicaid, Personal Care Services, Other  [Confirmed Disinterest in Applying for Medicaid, Personal Care  Services or Aid & Attendance Benefits.]  Mental Health Interventions   Mental Health Discussed/Reviewed Mental Health Discussed, Mental Health Reviewed, Crisis, Anxiety, Depression, Grief and Loss, Substance Abuse, Suicide, Coping Strategies, Other  [Assessed Mental Health &  Cognitive Status.]  Nutrition Interventions   Nutrition Discussed/Reviewed Nutrition Discussed, Adding fruits and vegetables, Increasing proteins, Fluid intake, Decreasing fats, Nutrition Reviewed, Carbohydrate meal planning, Portion sizes, Decreasing salt, Decreasing sugar intake  [Encouraged Diabetic-Friendly, Low Sodium, Reduced Sugar, Low-Fat Diet.]  Pharmacy Interventions   Pharmacy Dicussed/Reviewed Pharmacy Topics Discussed, Medications and their functions, Medication Adherence, Pharmacy Topics Reviewed, Affording Medications  [Confirmed Ability to Afford Prescription Medications.]  Medication Adherence --  [Confirmed Compliance with Prescription Medications.]  Safety Interventions   Safety Discussed/Reviewed Safety Discussed, Safety Reviewed, Fall Risk, Home Safety  [Encouraged Routine Use of Assistive Devices & Durable Medical Equipment.]  Home Safety Assistive Devices, Need for home safety assessment, Refer for community resources  [Encouraged Consideration of Home Safety Evaluation.]  Advanced Directive Interventions   Advanced Directives Discussed/Reviewed Advanced Directives Discussed, Advanced Directives Reviewed  [Encouraged Initation of Advanced Directives (Living Will & Healthcare Power of Corporate treasurer), Offering to [employer], Assist with Completion, Make Copies & Scan into Electronic Medical Record in Epic.]      Active Listening & Reflection Utilized.  Verbalization of Feelings Encouraged.  Emotional Support Provided.   Cognitive Behavioral Therapy Performed. Client-Centered Therapy Initiated. Encouraged Engagement with Danford Bad, Licensed Clinical Social Worker with [facility] [phone]), if You Have Questions, Need Assistance, Additional Social Work Needs Are Identified in The Near Future, or If You Change Your Mind About Wanting to Receive Additional Social Work Wellsite geologist.       Please call the care guide team at [phone] if you need to cancel or reschedule your appointment.   If you are experiencing a Mental Health or Behavioral Health Crisis or need someone to talk to, please call the Suicide and Crisis Lifeline: 988 call the [place] National Suicide Prevention Lifeline: (434)090-9124 or TTY: 810 197 7414 TTY (712)125-5289) to talk to a trained counselor call 1-800-273-TALK (toll free, 24 hour hotline) go to [facility] [address], [place] [phone]) call the [facility] Crisis Line: (646) [phone] call 911  Patient verbalizes understanding of instructions and care plan provided today and agrees to view in MyChart. Active MyChart status and patient understanding of how to access instructions and care plan via MyChart confirmed with patient.     No further follow up required.  Danford Bad, BSW, MSW, LCSW [facility], [facility] Clinical Social Worker II Direct Dial: [phone]  Fax: [phone] Website: [email or web address]

## 2023-10-06 NOTE — Patient Outreach (Signed)
Care Coordination   Follow Up Visit Note   10/06/2023  Name: Timothy Robertson MRN: [id number] DOB: Jul 17, 1946  Timothy Robertson is a 78 y.o. year old male who sees [facility] for [facility]. I spoke with Timothy Robertson by phone today.  What matters to the patients health and wellness today?  EMMI- Receive Counseling & Supportive Services to Reduce & Manage Symptoms of Anger.    Goals Addressed               This Visit's Progress     COMPLETED: EMMI- Receive Counseling & Supportive Services to Reduce & Manage Symptoms of Anger. 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, Shoulder Pain.]  General Interventions   General Interventions Discussed/Reviewed General Interventions Discussed, Labs, Vaccines, Doctor Visits, Health Screening, Annual Foot Exam, Lipid Profile, General Interventions Reviewed, Annual Eye Exam, Durable Medical Equipment (DME), Community Resources, Level of Care, Communication with  [Encouraged Routine Engagement with Care Team Members & Providers.]  Labs Hgb A1c every 3 months, Kidney Function  [Encouraged Routine Labwork.]  Vaccines COVID-19, Flu, Pneumonia, RSV, Shingles, Tetanus/Pertussis/Diphtheria  [Encouraged Routine Vaccinations.]  Doctor Visits Discussed/Reviewed Doctor Visits Discussed, Specialist, Doctor Visits Reviewed, Annual Wellness Visits, PCP  [Encouraged Routine Engagement with Care Team Members & Providers.]  Health Screening Bone Density, Colonoscopy, Prostate  [Encouraged Routine Health Screenings.]  Durable Medical Equipment (DME) Bed side  commode, BP Cuff, Glucomoter, Environmental consultant, Wheelchair, Tour manager, Other  [Eyeglasses,  Hand-held shower hose,  Grab bars in shower,  Bedside commode/3-in-1,  Blood pressure cuff,  Cane (specify quad or straight),  CBG Meter,  Grab bars around toilet,  Scales.]  Wheelchair Standard  PCP/Specialist Visits Compliance with follow-up visit  [Encouraged Routine Engagement with Care Team Members & Providers.]  Communication with PCP/Specialists, RN, Pharmacists, Social Work  [employer] Routine Engagement with Care Team Members & Providers.]  Level of Care Adult Daycare, Air traffic controller, Assisted Living, Skilled Nursing Facility  [Confirmed Disinterest in Enrollment in Adult Day Care Program or Receiving Assistance Pursuing Higher Level of Care Placement Options (I.e Assisted Living Versus Skilled Nursing Facility).]  Applications Medicaid, Personal Care Services, Other  [Confirmed Disinterest in Applying for Medicaid, Personal Care Services or Aid & Attendance Benefits.]  Exercise Interventions   Exercise Discussed/Reviewed Exercise Discussed, Assistive device use and maintanence, Exercise Reviewed, Physical Activity, Weight Managment  [Encouraged Daily Exercise Regimen, as Tolerated.]  Physical Activity Discussed/Reviewed Physical Activity Discussed, Home Exercise Program (HEP), PREP, Gym, Physical Activity Reviewed, Types of exercise  [Encouraged Increased Level of Activity & Exercise, Inside & Outside the Home.]  Weight Management Weight loss  [Encouraged Healthy Weight Loss Program.]  Education Interventions   Education Provided Provided Education  [employer] Reviewed Educational Material & Encouraged Consideration of Implementation.]  Provided Verbal Education On [employer], Development worker, community, Blood Sugar Monitoring, Exercise, Medication, Labs, Applications, Eye Care, Foot Care, Nutrition, Mental Health/Coping with Illness, When to see the doctor  [Encouraged Continued Independent Review of Educational  Material.]  Labs Reviewed Hgb A1c  [Reviewed & Encouraged Keeping a Log of Blood Sugar Readings.]  Applications Medicaid, Personal Care Services, Other  [Confirmed Disinterest in Applying for Medicaid, Personal Care Services or Aid &  Attendance Benefits.]  Mental Health Interventions   Mental Health Discussed/Reviewed Mental Health Discussed, Mental Health Reviewed, Crisis, Anxiety, Depression, Grief and Loss, Substance Abuse, Suicide, Coping Strategies, Other  [Assessed Mental Health & Cognitive Status.]  Nutrition Interventions   Nutrition Discussed/Reviewed Nutrition Discussed, Adding fruits and vegetables, Increasing proteins, Fluid intake, Decreasing fats, Nutrition Reviewed, Carbohydrate meal planning, Portion sizes, Decreasing salt, Decreasing sugar intake  [Encouraged Diabetic-Friendly, Low Sodium, Reduced Sugar, Low-Fat Diet.]  Pharmacy Interventions   Pharmacy Dicussed/Reviewed Pharmacy Topics Discussed, Medications and their functions, Medication Adherence, Pharmacy Topics Reviewed, Affording Medications  [Confirmed Ability to Afford Prescription Medications.]  Medication Adherence --  [Confirmed Compliance with Prescription Medications.]  Safety Interventions   Safety Discussed/Reviewed Safety Discussed, Safety Reviewed, Fall Risk, Home Safety  [Encouraged Routine Use of Assistive Devices & Durable Medical Equipment.]  Home Safety Assistive Devices, Need for home safety assessment, Refer for community resources  [Encouraged Consideration of Home Safety Evaluation.]  Advanced Directive Interventions   Advanced Directives Discussed/Reviewed Advanced Directives Discussed, Advanced Directives Reviewed  [Encouraged Initation of Advanced Directives (Living Will & Healthcare Power of Corporate treasurer), Offering to [employer], Assist with Completion, Make Copies & Scan into Electronic Medical Record in Epic.]      Active Listening & Reflection Utilized.  Verbalization of Feelings  Encouraged.  Emotional Support Provided.   Cognitive Behavioral Therapy Performed. Client-Centered Therapy Initiated. Encouraged Engagement with Timothy Robertson, Licensed Clinical Social Worker with [facility] [phone]), if You Have Questions, Need Assistance, Additional Social Work Needs Are Identified in The Near Future, or If You Change Your Mind About Wanting to Receive Additional Social Work Wellsite geologist.       SDOH assessments and interventions completed:  Yes.  Care Coordination Interventions:  Yes, provided.   Follow up plan: No further intervention required.   Encounter Outcome:  Patient Visit Completed.   Timothy Robertson, BSW, MSW, LCSW [facility], [facility] Clinical Social Worker II Direct Dial: [phone]  Fax: [phone] Website: [email or web address]

## 2023-10-12 ENCOUNTER — Other Ambulatory Visit (HOSPITAL_COMMUNITY): Payer: Self-pay | Admitting: Orthopedic Surgery

## 2023-10-12 DIAGNOSIS — M25511 Pain in right shoulder: Secondary | ICD-10-CM

## 2023-10-17 ENCOUNTER — Ambulatory Visit (HOSPITAL_COMMUNITY)
Admission: RE | Admit: 2023-10-17 | Discharge: 2023-10-17 | Disposition: A | Discharge status: Home / Self Care | Payer: No Typology Code available for payment source | Source: Ambulatory Visit | Attending: Orthopedic Surgery | Admitting: Orthopedic Surgery

## 2023-10-17 ENCOUNTER — Encounter (HOSPITAL_COMMUNITY): Payer: Self-pay

## 2023-10-17 DIAGNOSIS — M25511 Pain in right shoulder: Secondary | ICD-10-CM

## 2023-10-17 MED ORDER — TRIAMCINOLONE ACETONIDE INTRAVITREAL INJECTION 4 MG/0.1 ML
INTRAOCULAR | Status: AC
  Filled 2023-10-17: qty 1

## 2023-10-17 MED ORDER — LIDOCAINE HCL (PF) 1 % IJ SOLN
INTRAMUSCULAR | Status: AC
  Filled 2023-10-17: qty 5

## 2023-10-17 MED ORDER — METHYLPREDNISOLONE ACETATE 40 MG/ML IJ SUSP
INTRAMUSCULAR | Status: AC
  Filled 2023-10-17: qty 1

## 2023-10-17 MED ORDER — BUPIVACAINE HCL (PF) 0.5 % IJ SOLN
3.0000 mL | Freq: Once | INTRAMUSCULAR | Status: AC
  Administered 2023-10-17: 3 mL via INTRA_ARTICULAR

## 2023-10-17 MED ORDER — IOHEXOL 180 MG/ML  SOLN
15.0000 mL | Freq: Once | INTRAMUSCULAR | Status: AC | PRN
  Administered 2023-10-17: 10 mL via INTRA_ARTICULAR

## 2023-10-17 MED ORDER — METHYLPREDNISOLONE ACETATE 40 MG/ML INJ SUSP (RADIOLOG
40.0000 mg | Freq: Once | INTRAMUSCULAR | Status: AC
  Administered 2023-10-17: 40 mg via INTRA_ARTICULAR

## 2023-10-17 MED ORDER — BUPIVACAINE HCL (PF) 0.5 % IJ SOLN
INTRAMUSCULAR | Status: AC
Start: 2023-10-17 — End: ?
  Filled 2023-10-17: qty 30

## 2023-10-17 MED ORDER — LIDOCAINE HCL (PF) 1 % IJ SOLN
5.0000 mL | Freq: Once | INTRAMUSCULAR | Status: AC
  Administered 2023-10-17: 5 mL via INTRADERMAL

## 2023-10-17 NOTE — Progress Notes (Signed)
Patient tolerated right fluoro guided shoulder injection well today and verbalized understanding of discharge instructions. Patient ambulatory with cane at departure with no acute distress noted.

## 2023-10-17 NOTE — Procedures (Signed)
Pre procedural Dx: Right should pain Post procedural Dx: Same  Technically successful fluoro guided steroid injection of the right glenohumeral joint.  EBL: Trace Complications: None immediate  Katherina Right, MD Pager #: [phone]

## 2023-10-25 ENCOUNTER — Emergency Department (HOSPITAL_COMMUNITY): Payer: No Typology Code available for payment source

## 2023-10-25 ENCOUNTER — Encounter (HOSPITAL_COMMUNITY): Payer: Self-pay

## 2023-10-25 ENCOUNTER — Emergency Department (HOSPITAL_COMMUNITY)
Admission: EM | Admit: 2023-10-25 | Discharge: 2023-10-25 | Disposition: A | Discharge status: Home / Self Care | Payer: No Typology Code available for payment source | Attending: Emergency Medicine | Admitting: Emergency Medicine

## 2023-10-25 ENCOUNTER — Other Ambulatory Visit: Payer: Self-pay

## 2023-10-25 DIAGNOSIS — Z7982 Long term (current) use of aspirin: Secondary | ICD-10-CM | POA: Insufficient documentation

## 2023-10-25 DIAGNOSIS — Z8673 Personal history of transient ischemic attack (TIA), and cerebral infarction without residual deficits: Secondary | ICD-10-CM | POA: Diagnosis not present

## 2023-10-25 DIAGNOSIS — Z7984 Long term (current) use of oral hypoglycemic drugs: Secondary | ICD-10-CM | POA: Insufficient documentation

## 2023-10-25 DIAGNOSIS — M25551 Pain in right hip: Secondary | ICD-10-CM | POA: Insufficient documentation

## 2023-10-25 DIAGNOSIS — Z7902 Long term (current) use of antithrombotics/antiplatelets: Secondary | ICD-10-CM | POA: Diagnosis not present

## 2023-10-25 DIAGNOSIS — I2510 Atherosclerotic heart disease of native coronary artery without angina pectoris: Secondary | ICD-10-CM | POA: Insufficient documentation

## 2023-10-25 DIAGNOSIS — Z91040 Latex allergy status: Secondary | ICD-10-CM | POA: Diagnosis not present

## 2023-10-25 DIAGNOSIS — E119 Type 2 diabetes mellitus without complications: Secondary | ICD-10-CM | POA: Insufficient documentation

## 2023-10-25 MED ORDER — KETOROLAC TROMETHAMINE 15 MG/ML IJ SOLN
15.0000 mg | Freq: Once | INTRAMUSCULAR | Status: AC
  Administered 2023-10-25: 15 mg via INTRAVENOUS
  Filled 2023-10-25: qty 1

## 2023-10-25 MED ORDER — HYDROCODONE-ACETAMINOPHEN 5-325 MG PO TABS: 1.0000 | ORAL_TABLET | Freq: Four times a day (QID) | ORAL | 0 refills | Status: AC | PRN

## 2023-10-25 MED ORDER — LIDOCAINE 5 % EX PTCH
1.0000 | MEDICATED_PATCH | CUTANEOUS | Status: DC
  Administered 2023-10-25: 1 via TRANSDERMAL
  Filled 2023-10-25: qty 1

## 2023-10-25 NOTE — ED Provider Notes (Signed)
[place] EMERGENCY DEPARTMENT AT [facility] Provider Note   CSN: [id number] Arrival date & time: 10/25/23  4098     History  Chief Complaint  Patient presents with   Hip Pain    Timothy Robertson is a 78 y.o. male.  He has heart disease, osteoarthritis, CVA, type 2 diabetes.  Presents to ER for right hip pain ongoing for over a year.  Notes he has arthritis but is worse.  Not relieved with over-the-counter Excedrin.  He states he had a shot in the hip 2 weeks ago as well as in his right shoulder, states the shot worked in his right shoulder but he is not sure if they perhaps did not get the location right of the hip injection because he has no improvement in his pain in this location.  He states he had the injection locally [facility] him.  He denies numbness or tingling, no injury or trauma but states when he rotates his hip he feels like it is "coming out of joint".  Denies swelling or fever, no redness.  Hip Pain       Home Medications Prior to Admission medications   Medication Sig Start Date End Date Taking? Authorizing Provider  aspirin EC 81 MG tablet Take 81 mg by mouth daily.    [provider]  atorvastatin (LIPITOR) 80 MG tablet Take 80 mg by mouth at bedtime.    [provider]  butalbital-acetaminophen-caffeine (FIORICET) 50-325-40 MG tablet Take 1 tablet by mouth 3 (three) times daily as needed for headache.    [provider]  Calcium Carb-Cholecalciferol (CALCIUM 600+D3 PO) Take 1 tablet by mouth daily.    [provider]  cetirizine (ZYRTEC) 10 MG tablet Take 10 mg by mouth daily.    [provider]  CLEAR EYES PURE RELIEF PF 0.25 % SOLN Place 1-2 drops into both eyes 3 (three) times daily as needed (for irritation).    [provider]  clopidogrel (PLAVIX) 75 MG tablet Take 1 tablet by mouth daily. 05/10/23   [provider]  furosemide (LASIX) 20 MG tablet Take 40 mg by mouth in the morning.    [provider]  glipiZIDE (GLUCOTROL) 10 MG tablet Take 10 mg by mouth daily before breakfast.    [provider]  HYDROcodone-acetaminophen (NORCO/VICODIN) 5-325 MG tablet Take 1 tablet by mouth every 6 (six) hours as needed for moderate pain (pain score 4-6). 10/25/23  Yes Saud Bail A, PA-C  lisinopril (ZESTRIL) 40 MG tablet Take 40 mg by mouth daily.    [provider]  metFORMIN (GLUCOPHAGE-XR) 500 MG 24 hr tablet Take 500 mg by mouth in the morning and at bedtime.    [provider]  Omega-3 Fatty Acids (FISH OIL) 1000 MG CAPS Take 1,000 mg by mouth daily.     [provider]  potassium chloride (K-DUR) 10 MEQ tablet Take 10 mEq by mouth in the morning.    [provider]  topiramate (TOPAMAX) 25 MG tablet Take 25 mg by mouth daily as needed (headache prevention).    [provider]      Allergies    Penicillins, Sulfa antibiotics, Iron, Latex, and Liver    Review of Systems   Review of Systems  Physical Exam Updated Vital Signs BP (!) 162/95 (BP Location: Right Arm)   Pulse 67   Temp 97.9 F (36.6  C) (Oral)   Resp 16   Ht 5\' 8"  (1.727 m)   Wt 84.8 kg   SpO2 98%   BMI 28.43 kg/m  Physical Exam Vitals and nursing note reviewed.  Constitutional:      General: He is not in acute distress.    Appearance: He is well-developed.  HENT:     Head: Normocephalic and atraumatic.  Eyes:     Conjunctiva/sclera: Conjunctivae normal.  Cardiovascular:     Rate and Rhythm: Normal rate and regular rhythm.     Heart sounds: No murmur heard. Pulmonary:     Effort: Pulmonary effort is normal. No respiratory distress.     Breath sounds: Normal breath sounds.  Abdominal:     Palpations: Abdomen is soft.     Tenderness: There is no abdominal tenderness.  Musculoskeletal:        General: No swelling.     Cervical back: Neck supple.      Comments: Right hip has tenderness moderately over the greater trochanter.  Passive range of motion is intact but painful on flexion and external rotation.  Skin:    General: Skin is warm and dry.     Capillary Refill: Capillary refill takes less than 2 seconds.  Neurological:     General: No focal deficit present.     Mental Status: He is alert and oriented to person, place, and time.  Psychiatric:        Mood and Affect: Mood normal.     ED Results / Procedures / Treatments   Labs (all labs ordered are listed, but only abnormal results are displayed) Labs Reviewed - No data to display  EKG None  Radiology DG Hip Unilat W or Wo Pelvis 2-3 Views Right Result Date: 10/25/2023 CLINICAL DATA:  Chronic right hip pain EXAM: DG HIP (WITH OR WITHOUT PELVIS) 2-3V RIGHT COMPARISON:  CT pelvis 08/28/2020 FINDINGS: Moderate right and mild left craniocaudad loss of articular space compatible with degenerative hip arthropathy. Mild spurring of the acetabula. No fracture or acute bony findings. Lower lumbar spondylosis. Atherosclerosis is present, including aortoiliac atherosclerotic disease. IMPRESSION: 1. 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 2. Lower lumbar spondylosis. 3.  Aortic Atherosclerosis (ICD10-I70.0). Electronically Signed   By: Gaylyn Rong M.D.   On: 10/25/2023 13:47    Procedures Procedures    Medications Ordered in ED Medications  lidocaine (LIDODERM) 5 % 1 patch (1 patch Transdermal Patch Applied 10/25/23 1139)  ketorolac (TORADOL) 15 MG/ML injection 15 mg (15 mg Intravenous Given 10/25/23 1139)    ED Course/ Medical Decision Making/ A&P                                 Medical Decision Making This patient presents to the ED for concern of right hip pain, this involves an extensive number of treatment options, and is a complaint that carries with it a high risk of complications and morbidity.  The differential diagnosis includes rest, fracture,  bursitis, strain, other   Co morbidities that complicate the patient evaluation  CAD, diabetes   Additional history obtained:  Additional history obtained from EMR External records from outside source obtained and reviewed including prior notes and labs    Imaging Studies ordered:  I ordered imaging studies including x-ray right hip I independently visualized and interpreted imaging which showed degenerative changes I agree with the radiologist interpretation     Problem List / ED  Course / Critical interventions / Medication management  Having continued right hip pain over the past year, has gotten worse, not improved with injection recently.  He is much better after Toradol and lidocaine patch, given history of CAD, patient tries to avoid excessive NSAIDs at home so we will give small quantity of Norco.  Patient is agreeable with this.  Discussed with him and family were at bedside of risks and benefits.  He is going to follow-up with his doctor at the [facility] clinic.  They are already discussing hip replacement for him due to his pain from his arthritis. No signs of septic arthritis on exam, he has good range of motion.  He still able to ambulate with his cane at home. I ordered medication including Toradol for pain Reevaluation of the patient after these medicines showed that the patient improved I have reviewed the patients home medicines and have made adjustments as needed      Amount and/or Complexity of Data Reviewed Radiology: ordered.  Risk Prescription drug management.           Final Clinical Impression(s) / ED Diagnoses Final diagnoses:  Right hip pain    Rx / DC Orders ED Discharge Orders          Ordered    HYDROcodone-acetaminophen (NORCO/VICODIN) 5-325 MG tablet  Every 6 hours PRN        10/25/23 1339              Ma Rings, PA-C 10/25/23 1400    Derwood Kaplan, MD 10/28/23 [id number]

## 2023-10-25 NOTE — ED Triage Notes (Signed)
Pt arrived POV from home c/o right hip pain. Pt reports Hx or arthritis and pain has been worsening for the past few weeks. Pt reports he recently had a steroid injection in his shoulder for his shoulder pain, but the pain in his hip was not relieved.

## 2023-10-25 NOTE — Discharge Instructions (Addendum)
Seen in the ER for hip pain.  Your x-ray shows arthritis but no fracture or dislocation.  You were given pain medicine in the ER.  We are prescribing pain medication for home as well.  This is a narcotic pain medicine, it can make you sleepy, do not drive while taking this, be careful when taking it with any other medications that could make you tired.  You can also cause constipation so you should drink plenty of water while taking it and take stool softener while taking it as well.  Follow-up with your orthopedic doctor at the [facility] clinic.  Come back to the ER for severe pain, swelling, fever or redness or any other new or worsening symptoms.

## 2023-11-07 DIAGNOSIS — Z7189 Other specified counseling: Secondary | ICD-10-CM | POA: Diagnosis not present

## 2023-11-07 DIAGNOSIS — Z1331 Encounter for screening for depression: Secondary | ICD-10-CM | POA: Diagnosis not present

## 2023-11-07 DIAGNOSIS — I10 Essential (primary) hypertension: Secondary | ICD-10-CM | POA: Diagnosis not present

## 2023-11-07 DIAGNOSIS — Z0000 Encounter for general adult medical examination without abnormal findings: Secondary | ICD-10-CM | POA: Diagnosis not present

## 2023-11-07 DIAGNOSIS — J849 Interstitial pulmonary disease, unspecified: Secondary | ICD-10-CM | POA: Diagnosis not present

## 2023-11-07 DIAGNOSIS — R52 Pain, unspecified: Secondary | ICD-10-CM | POA: Diagnosis not present

## 2023-11-07 DIAGNOSIS — I5022 Chronic systolic (congestive) heart failure: Secondary | ICD-10-CM | POA: Diagnosis not present

## 2023-11-07 DIAGNOSIS — Z1339 Encounter for screening examination for other mental health and behavioral disorders: Secondary | ICD-10-CM | POA: Diagnosis not present

## 2023-11-07 DIAGNOSIS — Z299 Encounter for prophylactic measures, unspecified: Secondary | ICD-10-CM | POA: Diagnosis not present

## 2023-11-24 DIAGNOSIS — M1710 Unilateral primary osteoarthritis, unspecified knee: Secondary | ICD-10-CM | POA: Diagnosis not present

## 2023-11-24 DIAGNOSIS — Z299 Encounter for prophylactic measures, unspecified: Secondary | ICD-10-CM | POA: Diagnosis not present

## 2023-11-24 DIAGNOSIS — I10 Essential (primary) hypertension: Secondary | ICD-10-CM | POA: Diagnosis not present

## 2023-11-24 DIAGNOSIS — E1169 Type 2 diabetes mellitus with other specified complication: Secondary | ICD-10-CM | POA: Diagnosis not present

## 2023-11-24 DIAGNOSIS — R52 Pain, unspecified: Secondary | ICD-10-CM | POA: Diagnosis not present

## 2023-11-24 DIAGNOSIS — I5022 Chronic systolic (congestive) heart failure: Secondary | ICD-10-CM | POA: Diagnosis not present

## 2023-12-28 DIAGNOSIS — R5383 Other fatigue: Secondary | ICD-10-CM | POA: Diagnosis not present

## 2023-12-28 DIAGNOSIS — Z79899 Other long term (current) drug therapy: Secondary | ICD-10-CM | POA: Diagnosis not present

## 2023-12-28 DIAGNOSIS — E7800 Pure hypercholesterolemia, unspecified: Secondary | ICD-10-CM | POA: Diagnosis not present

## 2023-12-28 DIAGNOSIS — Z0000 Encounter for general adult medical examination without abnormal findings: Secondary | ICD-10-CM | POA: Diagnosis not present

## 2023-12-28 DIAGNOSIS — Z299 Encounter for prophylactic measures, unspecified: Secondary | ICD-10-CM | POA: Diagnosis not present

## 2023-12-28 DIAGNOSIS — R52 Pain, unspecified: Secondary | ICD-10-CM | POA: Diagnosis not present

## 2023-12-28 DIAGNOSIS — I10 Essential (primary) hypertension: Secondary | ICD-10-CM | POA: Diagnosis not present

## 2024-01-02 DIAGNOSIS — I714 Abdominal aortic aneurysm, without rupture, unspecified: Secondary | ICD-10-CM | POA: Diagnosis not present

## 2024-01-09 DIAGNOSIS — U071 COVID-19: Secondary | ICD-10-CM | POA: Diagnosis not present

## 2024-01-09 DIAGNOSIS — Z20822 Contact with and (suspected) exposure to covid-19: Secondary | ICD-10-CM | POA: Diagnosis not present

## 2024-01-09 DIAGNOSIS — Z299 Encounter for prophylactic measures, unspecified: Secondary | ICD-10-CM | POA: Diagnosis not present

## 2024-01-09 DIAGNOSIS — I5022 Chronic systolic (congestive) heart failure: Secondary | ICD-10-CM | POA: Diagnosis not present

## 2024-03-05 DIAGNOSIS — I10 Essential (primary) hypertension: Secondary | ICD-10-CM | POA: Diagnosis not present

## 2024-03-05 DIAGNOSIS — R52 Pain, unspecified: Secondary | ICD-10-CM | POA: Diagnosis not present

## 2024-03-05 DIAGNOSIS — Z299 Encounter for prophylactic measures, unspecified: Secondary | ICD-10-CM | POA: Diagnosis not present

## 2024-03-05 DIAGNOSIS — I5022 Chronic systolic (congestive) heart failure: Secondary | ICD-10-CM | POA: Diagnosis not present

## 2024-03-05 DIAGNOSIS — E1165 Type 2 diabetes mellitus with hyperglycemia: Secondary | ICD-10-CM | POA: Diagnosis not present

## 2024-03-05 DIAGNOSIS — M25511 Pain in right shoulder: Secondary | ICD-10-CM | POA: Diagnosis not present

## 2024-05-03 DIAGNOSIS — I6782 Cerebral ischemia: Secondary | ICD-10-CM | POA: Diagnosis not present

## 2024-05-03 DIAGNOSIS — R4701 Aphasia: Secondary | ICD-10-CM | POA: Diagnosis not present

## 2024-05-03 DIAGNOSIS — R531 Weakness: Secondary | ICD-10-CM | POA: Diagnosis not present

## 2024-05-03 DIAGNOSIS — G43909 Migraine, unspecified, not intractable, without status migrainosus: Secondary | ICD-10-CM | POA: Diagnosis not present

## 2024-05-03 DIAGNOSIS — R4182 Altered mental status, unspecified: Secondary | ICD-10-CM | POA: Diagnosis not present

## 2024-05-03 DIAGNOSIS — G4489 Other headache syndrome: Secondary | ICD-10-CM | POA: Diagnosis not present

## 2024-05-03 DIAGNOSIS — R519 Headache, unspecified: Secondary | ICD-10-CM | POA: Diagnosis not present

## 2024-05-03 DIAGNOSIS — H539 Unspecified visual disturbance: Secondary | ICD-10-CM | POA: Diagnosis not present

## 2024-05-08 DIAGNOSIS — E1169 Type 2 diabetes mellitus with other specified complication: Secondary | ICD-10-CM | POA: Diagnosis not present

## 2024-05-08 DIAGNOSIS — R52 Pain, unspecified: Secondary | ICD-10-CM | POA: Diagnosis not present

## 2024-05-08 DIAGNOSIS — M25511 Pain in right shoulder: Secondary | ICD-10-CM | POA: Diagnosis not present

## 2024-05-08 DIAGNOSIS — Z299 Encounter for prophylactic measures, unspecified: Secondary | ICD-10-CM | POA: Diagnosis not present

## 2024-05-08 DIAGNOSIS — I10 Essential (primary) hypertension: Secondary | ICD-10-CM | POA: Diagnosis not present

## 2024-05-08 DIAGNOSIS — G8191 Hemiplegia, unspecified affecting right dominant side: Secondary | ICD-10-CM | POA: Diagnosis not present

## 2024-05-09 DIAGNOSIS — Z125 Encounter for screening for malignant neoplasm of prostate: Secondary | ICD-10-CM | POA: Diagnosis not present

## 2024-05-09 DIAGNOSIS — Z79899 Other long term (current) drug therapy: Secondary | ICD-10-CM | POA: Diagnosis not present

## 2024-05-09 DIAGNOSIS — E7800 Pure hypercholesterolemia, unspecified: Secondary | ICD-10-CM | POA: Diagnosis not present

## 2024-05-09 DIAGNOSIS — R5383 Other fatigue: Secondary | ICD-10-CM | POA: Diagnosis not present

## 2024-06-03 DIAGNOSIS — I2510 Atherosclerotic heart disease of native coronary artery without angina pectoris: Secondary | ICD-10-CM | POA: Diagnosis not present

## 2024-06-03 DIAGNOSIS — Z882 Allergy status to sulfonamides status: Secondary | ICD-10-CM | POA: Diagnosis not present

## 2024-06-03 DIAGNOSIS — E119 Type 2 diabetes mellitus without complications: Secondary | ICD-10-CM | POA: Diagnosis not present

## 2024-06-03 DIAGNOSIS — Z7984 Long term (current) use of oral hypoglycemic drugs: Secondary | ICD-10-CM | POA: Diagnosis not present

## 2024-06-03 DIAGNOSIS — I110 Hypertensive heart disease with heart failure: Secondary | ICD-10-CM | POA: Diagnosis not present

## 2024-06-03 DIAGNOSIS — R918 Other nonspecific abnormal finding of lung field: Secondary | ICD-10-CM | POA: Diagnosis not present

## 2024-06-03 DIAGNOSIS — I10 Essential (primary) hypertension: Secondary | ICD-10-CM | POA: Diagnosis not present

## 2024-06-03 DIAGNOSIS — R079 Chest pain, unspecified: Secondary | ICD-10-CM | POA: Diagnosis not present

## 2024-06-03 DIAGNOSIS — R0989 Other specified symptoms and signs involving the circulatory and respiratory systems: Secondary | ICD-10-CM | POA: Diagnosis not present

## 2024-06-03 DIAGNOSIS — Z87891 Personal history of nicotine dependence: Secondary | ICD-10-CM | POA: Diagnosis not present

## 2024-06-03 DIAGNOSIS — I5022 Chronic systolic (congestive) heart failure: Secondary | ICD-10-CM | POA: Diagnosis not present

## 2024-06-03 DIAGNOSIS — Z86718 Personal history of other venous thrombosis and embolism: Secondary | ICD-10-CM | POA: Diagnosis not present

## 2024-06-03 DIAGNOSIS — Z880 Allergy status to penicillin: Secondary | ICD-10-CM | POA: Diagnosis not present

## 2024-06-03 DIAGNOSIS — Z95810 Presence of automatic (implantable) cardiac defibrillator: Secondary | ICD-10-CM | POA: Diagnosis not present

## 2024-06-04 DIAGNOSIS — I214 Non-ST elevation (NSTEMI) myocardial infarction: Secondary | ICD-10-CM | POA: Diagnosis not present

## 2024-06-04 DIAGNOSIS — E785 Hyperlipidemia, unspecified: Secondary | ICD-10-CM | POA: Diagnosis not present

## 2024-06-04 DIAGNOSIS — E119 Type 2 diabetes mellitus without complications: Secondary | ICD-10-CM | POA: Diagnosis not present

## 2024-06-04 DIAGNOSIS — E1151 Type 2 diabetes mellitus with diabetic peripheral angiopathy without gangrene: Secondary | ICD-10-CM | POA: Diagnosis not present

## 2024-06-04 DIAGNOSIS — R7989 Other specified abnormal findings of blood chemistry: Secondary | ICD-10-CM | POA: Diagnosis not present

## 2024-06-04 DIAGNOSIS — I5042 Chronic combined systolic (congestive) and diastolic (congestive) heart failure: Secondary | ICD-10-CM | POA: Diagnosis not present

## 2024-06-04 DIAGNOSIS — I110 Hypertensive heart disease with heart failure: Secondary | ICD-10-CM | POA: Diagnosis not present

## 2024-06-04 DIAGNOSIS — I340 Nonrheumatic mitral (valve) insufficiency: Secondary | ICD-10-CM | POA: Diagnosis not present

## 2024-06-04 DIAGNOSIS — Z880 Allergy status to penicillin: Secondary | ICD-10-CM | POA: Diagnosis not present

## 2024-06-04 DIAGNOSIS — I2510 Atherosclerotic heart disease of native coronary artery without angina pectoris: Secondary | ICD-10-CM | POA: Diagnosis not present

## 2024-06-04 DIAGNOSIS — I5022 Chronic systolic (congestive) heart failure: Secondary | ICD-10-CM | POA: Diagnosis not present

## 2024-06-04 DIAGNOSIS — Z7984 Long term (current) use of oral hypoglycemic drugs: Secondary | ICD-10-CM | POA: Diagnosis not present

## 2024-06-04 DIAGNOSIS — I10 Essential (primary) hypertension: Secondary | ICD-10-CM | POA: Diagnosis not present

## 2024-06-04 DIAGNOSIS — R0602 Shortness of breath: Secondary | ICD-10-CM | POA: Diagnosis not present

## 2024-06-04 DIAGNOSIS — Z87891 Personal history of nicotine dependence: Secondary | ICD-10-CM | POA: Diagnosis not present

## 2024-06-04 DIAGNOSIS — I255 Ischemic cardiomyopathy: Secondary | ICD-10-CM | POA: Diagnosis not present

## 2024-06-04 DIAGNOSIS — Z882 Allergy status to sulfonamides status: Secondary | ICD-10-CM | POA: Diagnosis not present

## 2024-06-04 DIAGNOSIS — Z86718 Personal history of other venous thrombosis and embolism: Secondary | ICD-10-CM | POA: Diagnosis not present

## 2024-06-04 DIAGNOSIS — I441 Atrioventricular block, second degree: Secondary | ICD-10-CM | POA: Diagnosis not present

## 2024-06-08 DIAGNOSIS — I10 Essential (primary) hypertension: Secondary | ICD-10-CM | POA: Diagnosis not present

## 2024-06-08 DIAGNOSIS — I219 Acute myocardial infarction, unspecified: Secondary | ICD-10-CM | POA: Diagnosis not present

## 2024-06-08 DIAGNOSIS — Z299 Encounter for prophylactic measures, unspecified: Secondary | ICD-10-CM | POA: Diagnosis not present

## 2024-06-08 DIAGNOSIS — R52 Pain, unspecified: Secondary | ICD-10-CM | POA: Diagnosis not present

## 2024-06-08 DIAGNOSIS — G8191 Hemiplegia, unspecified affecting right dominant side: Secondary | ICD-10-CM | POA: Diagnosis not present

## 2024-06-08 DIAGNOSIS — I5022 Chronic systolic (congestive) heart failure: Secondary | ICD-10-CM | POA: Diagnosis not present

## 2024-06-08 DIAGNOSIS — E119 Type 2 diabetes mellitus without complications: Secondary | ICD-10-CM | POA: Diagnosis not present

## 2024-06-11 DIAGNOSIS — I214 Non-ST elevation (NSTEMI) myocardial infarction: Secondary | ICD-10-CM | POA: Diagnosis not present

## 2024-06-13 DIAGNOSIS — E785 Hyperlipidemia, unspecified: Secondary | ICD-10-CM | POA: Diagnosis not present

## 2024-06-13 DIAGNOSIS — I214 Non-ST elevation (NSTEMI) myocardial infarction: Secondary | ICD-10-CM | POA: Diagnosis not present

## 2024-06-13 DIAGNOSIS — I441 Atrioventricular block, second degree: Secondary | ICD-10-CM | POA: Diagnosis not present

## 2024-06-13 DIAGNOSIS — E1151 Type 2 diabetes mellitus with diabetic peripheral angiopathy without gangrene: Secondary | ICD-10-CM | POA: Diagnosis not present

## 2024-06-13 DIAGNOSIS — I5032 Chronic diastolic (congestive) heart failure: Secondary | ICD-10-CM | POA: Diagnosis not present

## 2024-06-13 DIAGNOSIS — I25118 Atherosclerotic heart disease of native coronary artery with other forms of angina pectoris: Secondary | ICD-10-CM | POA: Diagnosis not present

## 2024-06-13 DIAGNOSIS — I110 Hypertensive heart disease with heart failure: Secondary | ICD-10-CM | POA: Diagnosis not present

## 2024-06-13 DIAGNOSIS — Z23 Encounter for immunization: Secondary | ICD-10-CM | POA: Diagnosis not present

## 2024-06-13 DIAGNOSIS — R112 Nausea with vomiting, unspecified: Secondary | ICD-10-CM | POA: Diagnosis not present

## 2024-06-14 DIAGNOSIS — I214 Non-ST elevation (NSTEMI) myocardial infarction: Secondary | ICD-10-CM | POA: Diagnosis not present

## 2024-06-14 DIAGNOSIS — I441 Atrioventricular block, second degree: Secondary | ICD-10-CM | POA: Diagnosis not present

## 2024-06-14 DIAGNOSIS — I110 Hypertensive heart disease with heart failure: Secondary | ICD-10-CM | POA: Diagnosis not present

## 2024-06-14 DIAGNOSIS — R112 Nausea with vomiting, unspecified: Secondary | ICD-10-CM | POA: Diagnosis not present

## 2024-06-14 DIAGNOSIS — E785 Hyperlipidemia, unspecified: Secondary | ICD-10-CM | POA: Diagnosis not present

## 2024-06-14 DIAGNOSIS — I5032 Chronic diastolic (congestive) heart failure: Secondary | ICD-10-CM | POA: Diagnosis not present

## 2024-06-14 DIAGNOSIS — E1151 Type 2 diabetes mellitus with diabetic peripheral angiopathy without gangrene: Secondary | ICD-10-CM | POA: Diagnosis not present

## 2024-06-14 DIAGNOSIS — I25118 Atherosclerotic heart disease of native coronary artery with other forms of angina pectoris: Secondary | ICD-10-CM | POA: Diagnosis not present

## 2024-06-14 DIAGNOSIS — R9431 Abnormal electrocardiogram [ECG] [EKG]: Secondary | ICD-10-CM | POA: Diagnosis not present

## 2024-06-14 DIAGNOSIS — Z9861 Coronary angioplasty status: Secondary | ICD-10-CM | POA: Diagnosis not present

## 2024-06-14 DIAGNOSIS — Z23 Encounter for immunization: Secondary | ICD-10-CM | POA: Diagnosis not present

## 2024-06-14 DIAGNOSIS — I2510 Atherosclerotic heart disease of native coronary artery without angina pectoris: Secondary | ICD-10-CM | POA: Diagnosis not present

## 2024-06-17 DIAGNOSIS — Z9861 Coronary angioplasty status: Secondary | ICD-10-CM | POA: Diagnosis not present

## 2024-06-26 DIAGNOSIS — I714 Abdominal aortic aneurysm, without rupture, unspecified: Secondary | ICD-10-CM | POA: Diagnosis not present

## 2024-06-26 DIAGNOSIS — Z86718 Personal history of other venous thrombosis and embolism: Secondary | ICD-10-CM | POA: Diagnosis not present

## 2024-06-26 DIAGNOSIS — R0789 Other chest pain: Secondary | ICD-10-CM | POA: Diagnosis not present

## 2024-06-26 DIAGNOSIS — E119 Type 2 diabetes mellitus without complications: Secondary | ICD-10-CM | POA: Diagnosis not present

## 2024-06-26 DIAGNOSIS — I214 Non-ST elevation (NSTEMI) myocardial infarction: Secondary | ICD-10-CM | POA: Diagnosis not present

## 2024-06-26 DIAGNOSIS — I5030 Unspecified diastolic (congestive) heart failure: Secondary | ICD-10-CM | POA: Diagnosis not present

## 2024-06-26 DIAGNOSIS — I10 Essential (primary) hypertension: Secondary | ICD-10-CM | POA: Diagnosis not present

## 2024-06-26 DIAGNOSIS — I2510 Atherosclerotic heart disease of native coronary artery without angina pectoris: Secondary | ICD-10-CM | POA: Diagnosis not present

## 2024-06-26 DIAGNOSIS — I252 Old myocardial infarction: Secondary | ICD-10-CM | POA: Diagnosis not present

## 2024-06-26 DIAGNOSIS — I5022 Chronic systolic (congestive) heart failure: Secondary | ICD-10-CM | POA: Diagnosis not present

## 2024-06-26 DIAGNOSIS — R9431 Abnormal electrocardiogram [ECG] [EKG]: Secondary | ICD-10-CM | POA: Diagnosis not present

## 2024-06-26 DIAGNOSIS — E1151 Type 2 diabetes mellitus with diabetic peripheral angiopathy without gangrene: Secondary | ICD-10-CM | POA: Diagnosis not present

## 2024-06-26 DIAGNOSIS — Z8719 Personal history of other diseases of the digestive system: Secondary | ICD-10-CM | POA: Diagnosis not present

## 2024-06-26 DIAGNOSIS — I447 Left bundle-branch block, unspecified: Secondary | ICD-10-CM | POA: Diagnosis not present

## 2024-06-26 DIAGNOSIS — I110 Hypertensive heart disease with heart failure: Secondary | ICD-10-CM | POA: Diagnosis not present

## 2024-06-26 DIAGNOSIS — G20A1 Parkinson's disease without dyskinesia, without mention of fluctuations: Secondary | ICD-10-CM | POA: Diagnosis not present

## 2024-06-27 DIAGNOSIS — R0789 Other chest pain: Secondary | ICD-10-CM | POA: Diagnosis not present

## 2024-07-25 DIAGNOSIS — K219 Gastro-esophageal reflux disease without esophagitis: Secondary | ICD-10-CM | POA: Diagnosis not present

## 2024-07-25 DIAGNOSIS — E785 Hyperlipidemia, unspecified: Secondary | ICD-10-CM | POA: Diagnosis not present

## 2024-07-25 DIAGNOSIS — I700 Atherosclerosis of aorta: Secondary | ICD-10-CM | POA: Diagnosis not present

## 2024-07-25 DIAGNOSIS — M1990 Unspecified osteoarthritis, unspecified site: Secondary | ICD-10-CM | POA: Diagnosis not present

## 2024-07-25 DIAGNOSIS — Z7982 Long term (current) use of aspirin: Secondary | ICD-10-CM | POA: Diagnosis not present

## 2024-07-25 DIAGNOSIS — I509 Heart failure, unspecified: Secondary | ICD-10-CM | POA: Diagnosis not present

## 2024-07-25 DIAGNOSIS — I25119 Atherosclerotic heart disease of native coronary artery with unspecified angina pectoris: Secondary | ICD-10-CM | POA: Diagnosis not present

## 2024-07-25 DIAGNOSIS — I110 Hypertensive heart disease with heart failure: Secondary | ICD-10-CM | POA: Diagnosis not present

## 2024-07-25 DIAGNOSIS — E1151 Type 2 diabetes mellitus with diabetic peripheral angiopathy without gangrene: Secondary | ICD-10-CM | POA: Diagnosis not present
